# Patient Record
Sex: Female | Born: 1990 | Race: Black or African American | Hispanic: No | Marital: Single | Smoking: Current every day smoker
Health system: Southern US, Community
[De-identification: ages and names within clinical notes are randomized; demographics above are authoritative.]

## PROBLEM LIST (undated history)

## (undated) DIAGNOSIS — R87629 Unspecified abnormal cytological findings in specimens from vagina: Secondary | ICD-10-CM

## (undated) DIAGNOSIS — K219 Gastro-esophageal reflux disease without esophagitis: Secondary | ICD-10-CM

## (undated) DIAGNOSIS — K589 Irritable bowel syndrome without diarrhea: Secondary | ICD-10-CM

## (undated) DIAGNOSIS — Z9109 Other allergy status, other than to drugs and biological substances: Secondary | ICD-10-CM

## (undated) HISTORY — PX: NO PAST SURGERIES: SHX2092

---

## 2011-12-17 ENCOUNTER — Encounter (HOSPITAL_COMMUNITY): Payer: Self-pay | Admitting: *Deleted

## 2011-12-17 ENCOUNTER — Emergency Department (HOSPITAL_COMMUNITY)
Admission: EM | Admit: 2011-12-17 | Discharge: 2011-12-17 | Disposition: A | Discharge status: Home / Self Care | Attending: Emergency Medicine | Admitting: Emergency Medicine

## 2011-12-17 DIAGNOSIS — R51 Headache: Secondary | ICD-10-CM | POA: Insufficient documentation

## 2011-12-17 DIAGNOSIS — J329 Chronic sinusitis, unspecified: Secondary | ICD-10-CM

## 2011-12-17 DIAGNOSIS — J029 Acute pharyngitis, unspecified: Secondary | ICD-10-CM | POA: Insufficient documentation

## 2011-12-17 DIAGNOSIS — J3489 Other specified disorders of nose and nasal sinuses: Secondary | ICD-10-CM | POA: Insufficient documentation

## 2011-12-17 MED ORDER — PSEUDOEPHEDRINE HCL ER 120 MG PO TB12: 120.0000 mg | ORAL_TABLET | Freq: Two times a day (BID) | ORAL | Status: DC

## 2011-12-17 NOTE — ED Provider Notes (Signed)
Medical screening examination/treatment/procedure(s) were performed by non-physician practitioner and as supervising physician I was immediately available for consultation/collaboration. Fenix Ruppe Y.   Camora Tremain Y. Branden Vine, MD 12/17/11 0043 

## 2011-12-17 NOTE — ED Notes (Signed)
Headache today with sinus  Pressure.  She has had a cold for 2-3 days

## 2011-12-17 NOTE — ED Notes (Signed)
Pt c/o congestion, sore throat and sinus pressure with headache. Appears in no acute distress.

## 2011-12-17 NOTE — ED Provider Notes (Signed)
History     CSN: [id number]  Arrival date & time 12/17/11  0010   First MD Initiated Contact with Patient 12/17/11 0030      Chief Complaint  Patient presents with  . Headache    (Consider location/radiation/quality/duration/timing/severity/associated sxs/prior treatment) HPI Comments: Patient with upper respiratory tract infection, facial fullness and pain, congestion, yesterday, took Mucinex, which has upset her stomach.  The symptoms have been going on for approximately one week.  Denies fever or chills  Patient is a 21 y.o. female presenting with headaches. The history is provided by the patient.  Headache  The current episode started more than 2 days ago. The problem occurs constantly. The pain is located in the frontal region. The pain is at a severity of 5/10. The pain is moderate. Pertinent negatives include no fever.    History reviewed. No pertinent past medical history.  History reviewed. No pertinent past surgical history.  History reviewed. No pertinent family history.  History  Substance Use Topics  . Smoking status: Never Smoker   . Smokeless tobacco: Not on file  . Alcohol Use: No    OB History    Grav Para Term Preterm Abortions TAB SAB Ect Mult Living                  Review of Systems  Constitutional: Negative for fever.  HENT: Positive for congestion, sore throat and sinus pressure. Negative for rhinorrhea.   Respiratory: Negative for cough.   Musculoskeletal: Positive for myalgias.  Neurological: Positive for headaches. Negative for dizziness and weakness.    Allergies  Latex  Home Medications   Current Outpatient Rx  Name Route Sig Dispense Refill  . DICYCLOMINE HCL 10 MG PO CAPS Oral Take 10 mg by mouth 4 (four) times daily.    Marland Kitchen LEVOCETIRIZINE DIHYDROCHLORIDE 5 MG PO TABS Oral Take 5 mg by mouth every evening.    Marland Kitchen MONTELUKAST SODIUM 10 MG PO TABS Oral Take 10 mg by mouth daily.    Marland Kitchen OMEPRAZOLE 20 MG PO CPDR Oral Take 20 mg by mouth  daily.    Marland Kitchen OVER THE COUNTER MEDICATION Oral Take 1 tablet by mouth once. Mucinex Cold and Flu (multi-symptom)    . PSEUDOEPHEDRINE HCL ER 120 MG PO TB12 Oral Take 1 tablet (120 mg total) by mouth every 12 (twelve) hours. 20 tablet 0    BP 129/81  Pulse 100  Temp(Src) 97.7 F (36.5 C) (Oral)  Resp 16  SpO2 99%  Physical Exam  Constitutional: She is oriented to person, place, and time. She appears well-developed.  Neck: Normal range of motion.  Cardiovascular: Normal rate.   Pulmonary/Chest: Effort normal.  Musculoskeletal: Normal range of motion.  Neurological: She is alert and oriented to person, place, and time.  Skin: Skin is warm and dry.  Psychiatric: She has a normal mood and affect.    ED Course  Procedures (including critical care time)  Labs Reviewed - No data to display No results found.   1. Sinusitis     Sinusitis will prescribe Sudafed, Tylenol Sinus, Advil sinus as over-the-counter medications  MDM  Sinusitis        Arman Filter, NP 12/17/11 0037  Arman Filter, NP 12/17/11 0040

## 2012-05-06 ENCOUNTER — Ambulatory Visit

## 2012-05-06 ENCOUNTER — Ambulatory Visit (INDEPENDENT_AMBULATORY_CARE_PROVIDER_SITE_OTHER): Admitting: Internal Medicine

## 2012-05-06 VITALS — BP 123/73 | HR 84 | Temp 98.1°F | Resp 16 | Ht 64.5 in | Wt 146.0 lb

## 2012-05-06 DIAGNOSIS — M79609 Pain in unspecified limb: Secondary | ICD-10-CM

## 2012-05-06 DIAGNOSIS — M79673 Pain in unspecified foot: Secondary | ICD-10-CM

## 2012-05-06 NOTE — Progress Notes (Signed)
  Subjective:    Patient ID: Courtney Martinez, female    DOB: 11/20/1991, 21 y.o.   MRN: [id number]  HPISomeone closed the door on her foot striking the medial aspect at the first MTP 4 days ago She continues to have pain with motion around first and second MTPs and great pain going up stairs    Review of Systems     Objective:   Physical Exam The foot is not swollen and there is no ecchymoses She is exquisitely tender over the medial aspect of the first MTP and over the area just proximal to the second MTP/range of motion of these areas hurts      [facility] reading (PRIMARY) by  Dr. Chia Mowers=No fracture seen around the first or second MTP   Assessment & Plan:  Problem #1 pain foot secondary to contusion Postop shoe to control motion around the first MTP Tylenol or ibuprofen as needed

## 2012-07-17 ENCOUNTER — Telehealth: Payer: Self-pay

## 2012-07-17 NOTE — Telephone Encounter (Signed)
Patient dropped of paperwork to be filled out by Dr. Merla Riches. Papers in [facility] [employer] box.

## 2012-07-17 NOTE — Telephone Encounter (Signed)
Accident Claim Form put in Dr Doolittle's box.

## 2012-07-23 NOTE — Telephone Encounter (Signed)
Notified pt that form is ready for p/up

## 2012-11-14 ENCOUNTER — Encounter (HOSPITAL_COMMUNITY): Payer: Self-pay | Admitting: Emergency Medicine

## 2012-11-14 ENCOUNTER — Emergency Department (HOSPITAL_COMMUNITY)
Admission: EM | Admit: 2012-11-14 | Discharge: 2012-11-14 | Disposition: A | Discharge status: Home / Self Care | Attending: Emergency Medicine | Admitting: Emergency Medicine

## 2012-11-14 DIAGNOSIS — J309 Allergic rhinitis, unspecified: Secondary | ICD-10-CM

## 2012-11-14 DIAGNOSIS — J3489 Other specified disorders of nose and nasal sinuses: Secondary | ICD-10-CM | POA: Insufficient documentation

## 2012-11-14 DIAGNOSIS — Z79899 Other long term (current) drug therapy: Secondary | ICD-10-CM | POA: Insufficient documentation

## 2012-11-14 DIAGNOSIS — K219 Gastro-esophageal reflux disease without esophagitis: Secondary | ICD-10-CM | POA: Insufficient documentation

## 2012-11-14 HISTORY — DX: Gastro-esophageal reflux disease without esophagitis: K21.9

## 2012-11-14 MED ORDER — LORATADINE 10 MG PO TABS: 10.0000 mg | ORAL_TABLET | Freq: Every day | ORAL | Status: DC

## 2012-11-14 MED ORDER — ALBUTEROL SULFATE HFA 108 (90 BASE) MCG/ACT IN AERS
2.0000 | INHALATION_SPRAY | RESPIRATORY_TRACT | Status: DC | PRN
  Administered 2012-11-14: 2 via RESPIRATORY_TRACT
  Filled 2012-11-14: qty 6.7

## 2012-11-14 NOTE — ED Notes (Signed)
PA at bedside.

## 2012-11-14 NOTE — ED Notes (Signed)
C/o sore throat, runny nose, and congestion since Saturday.

## 2012-11-14 NOTE — ED Provider Notes (Signed)
History     CSN: [id number]  Arrival date & time 11/14/12  2100   None     Chief Complaint  Patient presents with  . Sore Throat  . Nasal Congestion    (Consider location/radiation/quality/duration/timing/severity/associated sxs/prior treatment) HPI History provided by pt.   Pt has h/o allergic rhinitis, otherwise healthy.  Developed rhinorrhea, sneezing, pruritis of throat and chest tightness 4 days ago.  Now w/ sore throat, voice changes and mild cough.  Denies fever, sinus pressure, SOB, abd pain, N/V/D.  Has taken xyzal and singulair in the past w/out relief.  No known sick contacts.  Past Medical History  Diagnosis Date  . Acid reflux     History reviewed. No pertinent past surgical history.  No family history on file.  History  Substance Use Topics  . Smoking status: Never Smoker   . Smokeless tobacco: Not on file  . Alcohol Use: Yes     Comment: social    OB History    Grav Para Term Preterm Abortions TAB SAB Ect Mult Living                  Review of Systems  All other systems reviewed and are negative.    Allergies  Latex  Home Medications   Current Outpatient Rx  Name  Route  Sig  Dispense  Refill  . LEVOCETIRIZINE DIHYDROCHLORIDE 5 MG PO TABS   Oral   Take 5 mg by mouth daily as needed. For seasonal allergies         . MONTELUKAST SODIUM 10 MG PO TABS   Oral   Take 10 mg by mouth daily as needed. For seasonal allergies         . OMEPRAZOLE 20 MG PO CPDR   Oral   Take 20 mg by mouth daily as needed. For acid reflux         . PHENYLEPHRINE-DM-GG-APAP 5-10-200-325 MG PO TABS   Oral   Take 1 tablet by mouth every 12 (twelve) hours as needed. For chest congestion         . LORATADINE 10 MG PO TABS   Oral   Take 1 tablet (10 mg total) by mouth daily. One po bid x 3 days and then qd prn   30 tablet   0     BP 124/66  Pulse 66  Temp 98.2 F (36.8 C) (Oral)  Resp 18  SpO2 100%  Physical Exam  Nursing note and vitals  reviewed. Constitutional: She is oriented to person, place, and time. She appears well-developed and well-nourished. No distress.  HENT:  Head: Normocephalic and atraumatic.       No tonsillar edema, erythema or exudate.  Nml posterior pharynx.   Nasal congestion.   Eyes:       Normal appearance  Neck: Normal range of motion.  Cardiovascular: Normal rate and regular rhythm.   Pulmonary/Chest: Effort normal and breath sounds normal. No respiratory distress. She has no wheezes.  Musculoskeletal: Normal range of motion.  Lymphadenopathy:    She has no cervical adenopathy.  Neurological: She is alert and oriented to person, place, and time.  Skin: Skin is warm and dry. No rash noted.  Psychiatric: She has a normal mood and affect. Her behavior is normal.    ED Course  Procedures (including critical care time)  Labs Reviewed - No data to display No results found.   1. Allergic rhinitis       MDM  21yo F  w/ h/o allergic rhinitis presents w/ nasal congestion/rhinorrhea, sneezing, throat pruritis/pain, chest tightness and mild cough x 4 days.  No fever, respiratory distress, wheezing, sinus tenderness, tonsillar edema/exudate on exam.  Will treat symptomatically for viral URI vs. Allergic rhinitis.  Pt received an albuterol inhaler and I recommended claritin bid x 3 days and then qd as well as sudafed, ibuprofen, rest and fluids.  She has a steroid nasal spray at home.  Return precautions discussed.          Otilio Miu, PA-C 11/14/12 [phone]

## 2012-11-15 NOTE — ED Provider Notes (Signed)
Medical screening examination/treatment/procedure(s) were performed by non-physician practitioner and as supervising physician I was immediately available for consultation/collaboration.  Toy Baker, MD 11/15/12 [phone]

## 2013-09-09 ENCOUNTER — Encounter (HOSPITAL_COMMUNITY): Payer: Self-pay | Admitting: Emergency Medicine

## 2013-09-09 ENCOUNTER — Emergency Department (HOSPITAL_COMMUNITY)

## 2013-09-09 ENCOUNTER — Emergency Department (HOSPITAL_COMMUNITY)
Admission: EM | Admit: 2013-09-09 | Discharge: 2013-09-09 | Disposition: A | Discharge status: Home / Self Care | Attending: Emergency Medicine | Admitting: Emergency Medicine

## 2013-09-09 DIAGNOSIS — Z91040 Latex allergy status: Secondary | ICD-10-CM | POA: Insufficient documentation

## 2013-09-09 DIAGNOSIS — R059 Cough, unspecified: Secondary | ICD-10-CM | POA: Insufficient documentation

## 2013-09-09 DIAGNOSIS — J3489 Other specified disorders of nose and nasal sinuses: Secondary | ICD-10-CM | POA: Insufficient documentation

## 2013-09-09 DIAGNOSIS — Z8719 Personal history of other diseases of the digestive system: Secondary | ICD-10-CM | POA: Insufficient documentation

## 2013-09-09 DIAGNOSIS — B9789 Other viral agents as the cause of diseases classified elsewhere: Secondary | ICD-10-CM | POA: Insufficient documentation

## 2013-09-09 DIAGNOSIS — R05 Cough: Secondary | ICD-10-CM | POA: Insufficient documentation

## 2013-09-09 DIAGNOSIS — B349 Viral infection, unspecified: Secondary | ICD-10-CM

## 2013-09-09 MED ORDER — FEXOFENADINE-PSEUDOEPHED ER 60-120 MG PO TB12: 1.0000 | ORAL_TABLET | Freq: Two times a day (BID) | ORAL | Status: DC | PRN

## 2013-09-09 NOTE — ED Notes (Signed)
Patient states she has been having "sinus problems" for a few days.  Patient states is coughing and blowing green mucous.   Patient denies travel outside the country and has not been around anyone that has.   Patient states she usually gets nose bleeds and has had a couple.

## 2013-09-09 NOTE — ED Notes (Signed)
MD to bedside.

## 2013-09-15 NOTE — ED Provider Notes (Signed)
CSN: [id number]     Arrival date & time 09/09/13  1009 History   First MD Initiated Contact with Patient 09/09/13 1037     Chief Complaint  Patient presents with  . chest congestion   . Nasal Congestion   (Consider location/radiation/quality/duration/timing/severity/associated sxs/prior Treatment) HPI  22 year old female with facial pressure and congestion for the past 3-4 days. Occasional cough which is productive for greenish appearing mucus. No fevers or chills. No shortness of breath. No ear or throat pain. No nausea or vomiting. No urinary complaints. No unusual leg pain or swelling. No sick contacts. She has had some intermittent nosebleeds which was stopped with pressure. No significant past medical history except gerd. No intervention prior to arrival. Nonsmoker.  Past Medical History  Diagnosis Date  . Acid reflux    History reviewed. No pertinent past surgical history. No family history on file. History  Substance Use Topics  . Smoking status: Never Smoker   . Smokeless tobacco: Not on file  . Alcohol Use: Yes     Comment: social   OB History   Grav Para Term Preterm Abortions TAB SAB Ect Mult Living                 Review of Systems  All systems reviewed and negative, other than as noted in HPI.   Allergies  Latex  Home Medications   Current Outpatient Rx  Name  Route  Sig  Dispense  Refill  . OVER THE COUNTER MEDICATION   Oral   Take 5 mLs by mouth every 6 (six) hours as needed (for cold symptoms). Tyelnol Cold symptom liquid         . fexofenadine-pseudoephedrine (ALLEGRA-D) 60-120 MG per tablet   Oral   Take 1 tablet by mouth 2 (two) times daily as needed (congestion/cough).   20 tablet   0    BP 116/70  Pulse 83  Temp(Src) 98.3 F (36.8 C) (Oral)  Resp 18  SpO2 99% Physical Exam  Nursing note and vitals reviewed. Constitutional: She appears well-developed and well-nourished. No distress.  HENT:  Head: Normocephalic and atraumatic.   Eyes: Conjunctivae are normal. Right eye exhibits no discharge. Left eye exhibits no discharge.  Neck: Neck supple.  Cardiovascular: Normal rate, regular rhythm and normal heart sounds.  Exam reveals no gallop and no friction rub.   No murmur heard. Pulmonary/Chest: Effort normal and breath sounds normal. No respiratory distress.  Abdominal: Soft. She exhibits no distension. There is no tenderness.  Musculoskeletal: She exhibits no edema and no tenderness.  Lower extremities symmetric as compared to each other. No calf tenderness. Negative Homan's. No palpable cords.   Neurological: She is alert.  Skin: Skin is warm and dry.  Psychiatric: She has a normal mood and affect. Her behavior is normal. Thought content normal.    ED Course  Procedures (including critical care time) Labs Review Labs Reviewed - No data to display Imaging Review No results found.  EKG Interpretation   None      Dg Chest 2 View  09/09/2013   CLINICAL DATA:  Cough.  EXAM: CHEST  2 VIEW  COMPARISON:  None.  FINDINGS: The heart size and mediastinal contours are within normal limits. Both lungs are clear. The visualized skeletal structures are unremarkable.  IMPRESSION: No active cardiopulmonary disease.   Electronically Signed   By: Charlett Nose M.D.   On: 09/09/2013 10:48   MDM   1. Viral illness    22 year old female with likely  viral illness. No increased work of breathing. Lungs are clear. Oxygen saturations are good on room air. Chest x-ray without acute abnormality. Doubt pulmonary embolism or other potential emergent etiology. Plan symptomatic treatment at this time. Return precautions discussed. Outpatient followup as needed otherwise.    Raeford Razor, MD 09/15/13 [phone]

## 2013-12-02 ENCOUNTER — Encounter (HOSPITAL_COMMUNITY): Payer: Self-pay | Admitting: *Deleted

## 2013-12-02 ENCOUNTER — Inpatient Hospital Stay (HOSPITAL_COMMUNITY)
Admission: AD | Admit: 2013-12-02 | Discharge: 2013-12-02 | Disposition: A | Discharge status: Home / Self Care | Source: Ambulatory Visit | Attending: Obstetrics & Gynecology | Admitting: Obstetrics & Gynecology

## 2013-12-02 DIAGNOSIS — N938 Other specified abnormal uterine and vaginal bleeding: Secondary | ICD-10-CM | POA: Insufficient documentation

## 2013-12-02 DIAGNOSIS — A5901 Trichomonal vulvovaginitis: Secondary | ICD-10-CM

## 2013-12-02 DIAGNOSIS — N949 Unspecified condition associated with female genital organs and menstrual cycle: Secondary | ICD-10-CM | POA: Insufficient documentation

## 2013-12-02 DIAGNOSIS — N898 Other specified noninflammatory disorders of vagina: Secondary | ICD-10-CM | POA: Insufficient documentation

## 2013-12-02 DIAGNOSIS — R109 Unspecified abdominal pain: Secondary | ICD-10-CM | POA: Insufficient documentation

## 2013-12-02 LAB — URINALYSIS, ROUTINE W REFLEX MICROSCOPIC
BILIRUBIN URINE: NEGATIVE
GLUCOSE, UA: NEGATIVE mg/dL
Hgb urine dipstick: NEGATIVE
KETONES UR: NEGATIVE mg/dL
NITRITE: NEGATIVE
PH: 7.5 (ref 5.0–8.0)
PROTEIN: NEGATIVE mg/dL
Specific Gravity, Urine: 1.02 (ref 1.005–1.030)
Urobilinogen, UA: 4 mg/dL — ABNORMAL HIGH (ref 0.0–1.0)

## 2013-12-02 LAB — URINE MICROSCOPIC-ADD ON

## 2013-12-02 LAB — WET PREP, GENITAL
CLUE CELLS WET PREP: NONE SEEN
Yeast Wet Prep HPF POC: NONE SEEN

## 2013-12-02 LAB — POCT PREGNANCY, URINE: Preg Test, Ur: NEGATIVE

## 2013-12-02 MED ORDER — METRONIDAZOLE 500 MG PO TABS
2000.0000 mg | ORAL_TABLET | Freq: Once | ORAL | Status: AC
  Administered 2013-12-02: 2000 mg via ORAL
  Filled 2013-12-02: qty 4

## 2013-12-02 NOTE — Discharge Instructions (Signed)
Trichomoniasis Trichomoniasis is an infection, caused by the Trichomonas organism, that affects both women and men. In women, the outer female genitalia and the vagina are affected. In men, the penis is mainly affected, but the prostate and other reproductive organs can also be involved. Trichomoniasis is a sexually transmitted disease (STD) and is most often passed to another person through sexual contact. The majority of people who get trichomoniasis do so from a sexual encounter and are also at risk for other STDs. CAUSES   Sexual intercourse with an infected partner.  It can be present in swimming pools or hot tubs. SYMPTOMS   Abnormal gray-green frothy vaginal discharge in women.  Vaginal itching and irritation in women.  Itching and irritation of the area outside the vagina in women.  Penile discharge with or without pain in males.  Inflammation of the urethra (urethritis), causing painful urination.  Bleeding after sexual intercourse. RELATED COMPLICATIONS  Pelvic inflammatory disease.  Infection of the uterus (endometritis).  Infertility.  Tubal (ectopic) pregnancy.  It can be associated with other STDs, including gonorrhea and chlamydia, hepatitis B, and HIV. COMPLICATIONS DURING PREGNANCY  Early (premature) delivery.  Premature rupture of the membranes (PROM).  Low birth weight. DIAGNOSIS   Visualization of Trichomonas under the microscope from the vagina discharge.  Ph of the vagina greater than 4.5, tested with a test tape.  Trich Rapid Test.  Culture of the organism, but this is not usually needed.  It may be found on a Pap test.  Having a "strawberry cervix,"which means the cervix looks very red like a strawberry. TREATMENT   You may be given medication to fight the infection. Inform your caregiver if you could be or are pregnant. Some medications used to treat the infection should not be taken during pregnancy.  Over-the-counter medications or  creams to decrease itching or irritation may be recommended.  Your sexual partner will need to be treated if infected. HOME CARE INSTRUCTIONS   Take all medication prescribed by your caregiver.  Take over-the-counter medication for itching or irritation as directed by your caregiver.  Do not have sexual intercourse while you have the infection.  Do not douche or wear tampons.  Discuss your infection with your partner, as your partner may have acquired the infection from you. Or, your partner may have been the person who transmitted the infection to you.  Have your sex partner examined and treated if necessary.  Practice safe, informed, and protected sex.  See your caregiver for other STD testing. SEEK MEDICAL CARE IF:   You still have symptoms after you finish the medication.  You have an oral temperature above 102 F (38.9 C).  You develop belly (abdominal) pain.  You have pain when you urinate.  You have bleeding after sexual intercourse.  You develop a rash.  The medication makes you sick or makes you throw up (vomit). Document Released: 05/10/2001 Document Revised: 02/06/2012 Document Reviewed: 06/05/2009 [facility] Patient Information 2014 [place], [place].  Sexually Transmitted Disease Sexually transmitted disease (STD) refers to any infection that is passed from person to person during sexual activity. This may happen by way of saliva, semen, blood, vaginal mucus, or urine. Common STDs include:  Gonorrhea.  Chlamydia.  Syphilis.  HIV/AIDS.  Genital herpes.  Hepatitis B and C.  Trichomonas.  Human papillomavirus (HPV).  Pubic lice. CAUSES  An STD may be spread by bacteria, virus, or parasite. A person can get an STD by:  Sexual intercourse with an infected person.  Sharing  sex toys with an infected person.  Sharing needles with an infected person.  Having intimate contact with the genitals, mouth, or rectal areas of an infected person. SYMPTOMS    Some people may not have any symptoms, but they can still pass the infection to others. Different STDs have different symptoms. Symptoms include:  Painful or bloody urination.  Pain in the pelvis, abdomen, vagina, anus, throat, or eyes.  Skin rash, itching, irritation, growths, or sores (lesions). These usually occur in the genital or anal area.  Abnormal vaginal discharge.  Penile discharge in men.  Soft, flesh-colored skin growths in the genital or anal area.  Fever.  Pain or bleeding during sexual intercourse.  Swollen glands in the groin area.  Yellow skin and eyes (jaundice). This is seen with hepatitis. DIAGNOSIS  To make a diagnosis, your caregiver may:  Take a medical history.  Perform a physical exam.  Take a specimen (culture) to be examined.  Examine a sample of discharge under a microscope.  Perform blood tests.  Perform a Pap test, if this applies.  Perform a colposcopy.  Perform a laparoscopy. TREATMENT   Chlamydia, gonorrhea, trichomonas, and syphilis can be cured with antibiotic medicine.  Genital herpes, hepatitis, and HIV can be treated, but not cured, with prescribed medicines. The medicines will lessen the symptoms.  Genital warts from HPV can be treated with medicine or by freezing, burning (electrocautery), or surgery. Warts may come back.  HPV is a virus and cannot be cured with medicine or surgery.However, abnormal areas may be followed very closely by your caregiver and may be removed from the cervix, vagina, or vulva through office procedures or surgery. If your diagnosis is confirmed, your recent sexual partners need treatment. This is true even if they are symptom-free or have a negative culture or evaluation. They should not have sex until their caregiver says it is okay. HOME CARE INSTRUCTIONS  All sexual partners should be informed, tested, and treated for all STDs.  Take your antibiotics as directed. Finish them even if you start  to feel better.  Only take over-the-counter or prescription medicines for pain, discomfort, or fever as directed by your caregiver.  Rest.  Eat a balanced diet and drink enough fluids to keep your urine clear or pale yellow.  Do not have sex until treatment is completed and you have followed up with your caregiver. STDs should be checked after treatment.  Keep all follow-up appointments, Pap tests, and blood tests as directed by your caregiver.  Only use latex condoms and water-soluble lubricants during sexual activity. Do not use petroleum jelly or oils.  Avoid alcohol and illegal drugs.  Get vaccinated for HPV and hepatitis. If you have not received these vaccines in the past, talk to your caregiver about whether one or both might be right for you.  Avoid risky sex practices that can break the skin. The only way to avoid getting an STD is to avoid all sexual activity.Latex condoms and dental dams (for oral sex) will help lessen the risk of getting an STD, but will not completely eliminate the risk. SEEK MEDICAL CARE IF:   You have a fever.  You have any new or worsening symptoms. Document Released: 02/04/2003 Document Revised: 02/06/2012 Document Reviewed: 06/04/2013 [facility] Patient Information 2014 [place]

## 2013-12-02 NOTE — MAU Note (Signed)
Patient states she has been getting Depo injections. Was about 3 weeks late getting the last shot but got it about 2 weeks ago. States she has had some light spotting and cramping.

## 2013-12-02 NOTE — MAU Provider Note (Signed)
History     CSN: [id number]  Arrival date and time: 12/02/13 1608   None     Chief Complaint  Patient presents with  . Vaginal Discharge  . Abdominal Cramping   HPI This is a 23 y.o. female who presents with c/o cramping and spotting for several days. Was over 3 weeks late getting her DepoProvera and then got it at [facility] 2 wks ago. ALso has some vaginal discharge. Just had STD testing but wants to check for yeast, etc. Never had bleeding while on DepoProvera before.   RN Note:  Patient states she has been getting Depo injections. Was about 3 weeks late getting the last shot but got it about 2 weeks ago. States she has had some light spotting and cramping.      Also has new discharge, no odor, no itching, but wants it checked out. Just had STD testing in Sept, no intercourse since then.    OB History   Grav Para Term Preterm Abortions TAB SAB Ect Mult Living                  Past Medical History  Diagnosis Date  . Acid reflux     History reviewed. No pertinent past surgical history.  History reviewed. No pertinent family history.  History  Substance Use Topics  . Smoking status: Never Smoker   . Smokeless tobacco: Not on file  . Alcohol Use: Yes     Comment: social    Allergies:  Allergies  Allergen Reactions  . Latex     Patient stated just "spot" allergy on face    Prescriptions prior to admission  Medication Sig Dispense Refill  . fexofenadine-pseudoephedrine (ALLEGRA-D) 60-120 MG per tablet Take 1 tablet by mouth 2 (two) times daily as needed (congestion/cough).  20 tablet  0  . OVER THE COUNTER MEDICATION Take 5 mLs by mouth every 6 (six) hours as needed (for cold symptoms). Tyelnol Cold symptom liquid        Review of Systems  Constitutional: Negative for fever, chills and malaise/fatigue.  Gastrointestinal: Positive for abdominal pain. Negative for nausea, vomiting, diarrhea and constipation.  Genitourinary: Negative for dysuria.   Musculoskeletal: Negative for myalgias.  Neurological: Negative for dizziness, weakness and headaches.   Physical Exam   Blood pressure 119/72, pulse 68, temperature 98.5 F (36.9 C), temperature source Oral, resp. rate 16, height 5' 4.5" (1.638 m), weight 60.419 kg (133 lb 3.2 oz), SpO2 100.00%.  Physical Exam  Constitutional: She is oriented to person, place, and time. She appears well-developed and well-nourished. No distress.  HENT:  Head: Normocephalic.  Cardiovascular: Normal rate.   Respiratory: Effort normal.  GI: Soft. She exhibits no distension. There is no tenderness. There is no rebound and no guarding.  Genitourinary: Uterus normal. Vaginal discharge (No blodd seen, slightly pink discharge) found.  Uterus small and nontender Adnexae nontender   Musculoskeletal: Normal range of motion.  Neurological: She is alert and oriented to person, place, and time.  Skin: Skin is warm and dry.  Psychiatric: She has a normal mood and affect.    MAU Course  Procedures  MDM Results for orders placed during the hospital encounter of 12/02/13 (from the past 24 hour(s))  URINALYSIS, ROUTINE W REFLEX MICROSCOPIC     Status: Abnormal   Collection Time    12/02/13  4:55 PM      Result Value Range   Color, Urine YELLOW  YELLOW   APPearance HAZY (*) CLEAR  Specific Gravity, Urine 1.020  1.005 - 1.030   pH 7.5  5.0 - 8.0   Glucose, UA NEGATIVE  NEGATIVE mg/dL   Hgb urine dipstick NEGATIVE  NEGATIVE   Bilirubin Urine NEGATIVE  NEGATIVE   Ketones, ur NEGATIVE  NEGATIVE mg/dL   Protein, ur NEGATIVE  NEGATIVE mg/dL   Urobilinogen, UA 4.0 (*) 0.0 - 1.0 mg/dL   Nitrite NEGATIVE  NEGATIVE   Leukocytes, UA MODERATE (*) NEGATIVE  URINE MICROSCOPIC-ADD ON     Status: Abnormal   Collection Time    12/02/13  4:55 PM      Result Value Range   Squamous Epithelial / LPF FEW (*) RARE   WBC, UA 21-50  <3 WBC/hpf   Urine-Other MUCOUS PRESENT    POCT PREGNANCY, URINE     Status: None    Collection Time    12/02/13  5:12 PM      Result Value Range   Preg Test, Ur NEGATIVE  NEGATIVE  WET PREP, GENITAL     Status: Abnormal   Collection Time    12/02/13  6:08 PM      Result Value Range   Yeast Wet Prep HPF POC NONE SEEN  NONE SEEN   Trich, Wet Prep FEW (*) NONE SEEN   Clue Cells Wet Prep HPF POC NONE SEEN  NONE SEEN   WBC, Wet Prep HPF POC MANY (*) NONE SEEN     Assessment and Plan  A:  Cramping and spotting after missing DepoProvera for a month       Trichomonas infection  P; Informed her that missing Depo for so long is probably why she has cramping and bleeding.       Will probably resolve once she gets back on it for a few months      Discussed Trich      Flagyl given, informed of need to contact her partner to tell him to get treated.        [facility] CenterWILLIAMS,Talea Manges 12/02/2013, 5:57 PM

## 2014-04-16 ENCOUNTER — Emergency Department (HOSPITAL_COMMUNITY)
Admission: EM | Admit: 2014-04-16 | Discharge: 2014-04-16 | Disposition: A | Discharge status: Home / Self Care | Attending: Emergency Medicine | Admitting: Emergency Medicine

## 2014-04-16 ENCOUNTER — Encounter (HOSPITAL_COMMUNITY): Payer: Self-pay | Admitting: Emergency Medicine

## 2014-04-16 DIAGNOSIS — H5710 Ocular pain, unspecified eye: Secondary | ICD-10-CM

## 2014-04-16 DIAGNOSIS — M25519 Pain in unspecified shoulder: Secondary | ICD-10-CM | POA: Insufficient documentation

## 2014-04-16 DIAGNOSIS — K219 Gastro-esophageal reflux disease without esophagitis: Secondary | ICD-10-CM | POA: Insufficient documentation

## 2014-04-16 DIAGNOSIS — M549 Dorsalgia, unspecified: Secondary | ICD-10-CM | POA: Insufficient documentation

## 2014-04-16 DIAGNOSIS — M62838 Other muscle spasm: Secondary | ICD-10-CM | POA: Insufficient documentation

## 2014-04-16 DIAGNOSIS — H5789 Other specified disorders of eye and adnexa: Secondary | ICD-10-CM | POA: Insufficient documentation

## 2014-04-16 DIAGNOSIS — H579 Unspecified disorder of eye and adnexa: Secondary | ICD-10-CM | POA: Insufficient documentation

## 2014-04-16 DIAGNOSIS — Z91040 Latex allergy status: Secondary | ICD-10-CM | POA: Insufficient documentation

## 2014-04-16 DIAGNOSIS — Z87828 Personal history of other (healed) physical injury and trauma: Secondary | ICD-10-CM | POA: Insufficient documentation

## 2014-04-16 DIAGNOSIS — J309 Allergic rhinitis, unspecified: Secondary | ICD-10-CM | POA: Insufficient documentation

## 2014-04-16 MED ORDER — CYCLOBENZAPRINE HCL 10 MG PO TABS: 10.0000 mg | ORAL_TABLET | Freq: Every day | ORAL | Status: DC

## 2014-04-16 MED ORDER — NAPROXEN 500 MG PO TABS: 500.0000 mg | ORAL_TABLET | Freq: Two times a day (BID) | ORAL | Status: DC

## 2014-04-16 NOTE — Progress Notes (Signed)
[facility] CL provided pt with a list of primary care resources to help patient establish primary care.  °

## 2014-04-16 NOTE — Discharge Instructions (Signed)
Call for a follow up appointment with a Family or Primary Care Provider.  Return if Symptoms worsen.   Take medication as prescribed.  Avoid environmental allergens that worsened her symptoms. Ice your shoulder 3-4 times a day.   Emergency Department Resource Guide 1) Find a Doctor and Pay Out of Pocket Although you won't have to find out who is covered by your insurance plan, it is a good idea to ask around and get recommendations. You will then need to call the office and see if the doctor you have chosen will accept you as a new patient and what types of options they offer for patients who are self-pay. Some doctors offer discounts or will set up payment plans for their patients who do not have insurance, but you will need to ask so you aren't surprised when you get to your appointment.  2) Contact Your Local Health Department Not all health departments have doctors that can see patients for sick visits, but many do, so it is worth a call to see if yours does. If you don't know where your local health department is, you can check in your phone book. The CDC also has a tool to help you locate your state's health department, and many state websites also have listings of all of their local health departments.  3) Find a Walk-in Clinic If your illness is not likely to be very severe or complicated, you may want to try a walk in clinic. These are popping up all over the country in pharmacies, drugstores, and shopping centers. They're usually staffed by nurse practitioners or physician assistants that have been trained to treat common illnesses and complaints. They're usually fairly quick and inexpensive. However, if you have serious medical issues or chronic medical problems, these are probably not your best option.  No Primary Care Doctor: - Call Health Connect at  [phone] - they can help you locate a primary care doctor that  accepts your insurance, provides certain services, etc. - Physician  Referral Service- [phone]  Chronic Pain Problems: Organization         Address  Phone   Notes  [facility]  [phone] Patients need to be referred by their primary care doctor.   Medication Assistance: Organization         Address  Phone   Notes  [facility] Medication [facility]istance Program [address] [place], [place] [address] [phone]) [phone] --Must be a resident of [place] -- Must have NO insurance coverage whatsoever (no Medicaid/ Medicare, etc.) -- The pt. MUST have a primary care doctor that directs their care regularly and follows them in the community   MedAssist  [phone]   [employer]  [phone]    Agencies that provide inexpensive medical care: Organization         Address  Phone   Notes  [facility]  [phone]) [phone]   [facility]    [phone]   [facility] [address] [place], [place] [address] [phone]   [facility] [address], [place] [phone]) [phone]   Planned Parenthood    [phone]   [facility]    [phone]   [facility]  [address], [place] Phone:  [phone], Fax:  [phone] Hours of Operation:  9 am - 6 pm, M-F.  Also accepts Medicaid/Medicare and self-pay.  [facility] for [place], [address], [place] Phone: [phone], Fax: [phone]. Hours of Operation:  8:30 am - 5:30 pm, M-F.  Also accepts Medicaid and self-pay.  [facility] [address] [place] Phone: [phone]   [place], [place], [place] [phone], Ext. [phone] Mondays & Thursdays: 7-9 AM.  First 15 patients are seen on a first come, first serve basis.    [place] Providers:  Organization         Address  Phone   Notes  [facility] [address], [place] [phone] Also accepts self-pay patients.  [facility] [address], [place]  [phone]   [place], [address], [place] [phone]   [facility] [address], [place] [phone]   Lucianne Lei [address], [place]   [phone] Only accepts [place] Access [place] patients after they have their name applied to their card.   Self-Pay (no insurance) in [facility]:  Organization         Address  Phone   Notes  Sickle Cell Patients, [facility] Medicine [place] [phone]   [facility]gent [facility] [place] [phone]   [facility] [place]  [place], [place], [place] [phone]   [facility]/Dr. Osei-Bonsu  [address] [place] or [address], [place] [phone] Phone number for both [place] and [place] locations is the same.  Urgent Medical and [facility] [address] [place] [phone]   [facility] [address] [place] or [address] [phone]   [facility] [address] [place] [phone], phone; [phone], fax Sees patients 1st and 3rd Saturday of every month.  Must not qualify for public or private insurance (i.e. Medicaid, Medicare, [place] Health Choice, Veterans' Benefits)  Household income should be no more than 200% of the poverty level The clinic cannot treat you if you are pregnant or think you are pregnant  Sexually transmitted diseases are not treated at the clinic.    Dental Care: Organization         Address  Phone  Notes  [facility] Department of [facility] [place] [phone] Accepts children up to age 84 who are enrolled  in [place] or [place]; pregnant women with a Medicaid card; and children who have applied for Medicaid or [place] Health Choice, but were declined, whose parents can pay a reduced fee at time of service.  [facility] of [facility]  [address], [place] [phone] Accepts children up to age 33 who are enrolled in [place] or [place]; pregnant women with a Medicaid card; and children who have applied for Medicaid or [place] Health Choice, but were declined, whose parents can pay a reduced fee at time of service.  [place] [facility] Adult [facility] Access PROGRAM  [place] [phone] Patients are seen by appointment only. Walk-ins are not accepted. [place] will see patients 49 years of age and older. Monday - Tuesday (8am-5pm) Most Wednesdays (8:30-5pm) $30 per  visit, cash only  [facility] Adult [employer] PROGRAM  [address], [facility] [phone] Patients are seen by appointment only. Walk-ins are not accepted. [place] will see patients 75 years of age and older. One Wednesday Evening (Monthly: Volunteer Based).  $30 per visit, cash only  [place]  [phone] for adults; Children under age 64, call Graduate Pediatric Dentistry at [phone]. Children aged 33-14, please call [phone] to request a pediatric application.  Dental services are provided in all areas of dental care including fillings, crowns and bridges, complete and partial dentures, implants, gum treatment, root canals, and extractions. Preventive care is also provided. Treatment is provided to both adults and children. Patients are selected via a lottery and there is often a waiting list.   [facility] [address], [place]  [phone] [email or web address]   [facility] [address] [place], [place] [phone], Ext. [phone] Second and Fourth Thursday of each month, opens at  6:30 AM; Clinic ends at 9 AM.  Patients are seen on a first-come first-served basis, and a limited number are seen during each clinic.   [facility]  [address] Hillard Danker [place], [place] [phone]   Eligibility Requirements You must have lived in [place], [place], or [place] counties for at least the last three months.   You cannot be eligible for state or federal sponsored [employer], including [employer], [place], or [employer].   You generally cannot be eligible for healthcare insurance through your employer.    How to apply: Eligibility screenings are held every Tuesday and Wednesday afternoon from 1:00 pm until 4:00 pm. You do not need an appointment for the interview!  [facility] [address], [place], [place]   [place]  [place] Department  [place]  [phone]    Behavioral Health Resources in the Community: Intensive Outpatient Programs Organization         Address  Phone  Notes  [place]. [address] [place], [place] [phone]   [facility]patient [address], [place], [place]   ADS: Alcohol & Drug Svcs [address], [place], [place]   [place] [address]  [place], [place] or [phone]   Substance Abuse Resources Organization         Address  Phone  Notes  Alcohol and Drug Services  [phone]   [place]  [phone]   The [place]   Chinita Pester  [phone]   Residential & Outpatient Substance Abuse Program  [phone]   Psychological Services Organization         Address  Phone  Notes  [facility]  [place]  [phone]   [place] [address], [place] or [phone]    Mobile Crisis Teams Organization         Address  Phone  Notes  [facility]rapeutic Alternatives, Mobile Crisis Care Unit  [phone]   [facility]ive Psychotherapeutic Services  [address] [place], [place]   [place] Levels [address] [place] [phone]    Self-Help/Support Groups Organization         Address  Phone  Notes  Mental Health Assoc. of [place] - variety of support groups  [place] Call for more information  Narcotics Anonymous (NA), Caring Services [address], [employer] [place]  2 meetings at this location   Special educational needs teacher         Address  Phone  Notes  ASAP Residential Treatment [place],    [place]  [phone]   [facility]  [address] [place] [phone], [place], [place]   [place], [place] [phone] Admissions: 8am-3pm M-F  Incentives Substance [place] [address],    [place], [place] [phone]   [facility] [address] [place], [place], [place]   [facility] [address]  [place], [place]   Insight Programs - Intensive Outpatient [address] Kristeen [place] [address], [place], [place]   [facility] ([place].) [place].,  [place], [place] [phone] or [phone]   [facility]idential Treatment Services (RTS) [address], [place], [place] Accepts Medicaid  [facility] [place] [address]  [place] [phone] Substance Abuse/Addiction Treatment   [facility] Organization         Address  Phone  Notes  [facility]  [phone]   Domenic Schwab, PhD [address] Arlis Porta [place], [place]   [phone] or [phone]   [place], [place] [phone]   [facility]  [address], [place], [place] [phone] Insurance/Medicaid/sponsorship through [facility] and Families [address]                                    [place], [place] [phone] [place] [address], [place] [phone]    Dr. Adele Schilder  [phone]   [facility] Dept. 1) [address], [place] 2) [place] 3)  [place] [address], [place] [phone]  [phone]   [place] [phone] or [phone] (After Hours)

## 2014-04-16 NOTE — ED Notes (Signed)
Pt c/o lt shoulder pain w/ mid back pain x 2 wks.  Describes it as soreness.  States she had "trouble with her shoulder since she was little". States that she also has bad allergies and woke up yesterday w/ her eyelids swollen.  No swelling noted now.  States that her carpet in her apartment got wet and they are drying it so she attributes it to that.

## 2014-04-16 NOTE — ED Provider Notes (Signed)
CSN: [id number]     Arrival date & time 04/16/14  1313 History  This chart was scribed for non-physician practitioner, Clabe SealLauren M Garnetta Fedrick, PA-C, working with Gilda Creasehristopher J. Pollina, * by Charline BillsEssence Howell, ED Scribe. This patient was seen in room WTR9/WTR9 and the patient's care was started at 2:44 PM.   Chief Complaint  Patient presents with  . Shoulder Pain  . Back Pain  . Eye Problem   HPI Comments: Courtney Martinez is a 23 y.o. female who presents to the Emergency Department complaining of L shoulder pain onset 1 week ago. Pt also reports associated back pain onset last night. Pain is worsened with movement and stiff upon waking. She denies fall or injury. She has not taken any medication for pain. No known allergies. The patient reports a remote history of left shoulder dislocation. Has not been evaluated by an orthopedic specialist. She also presents with gradually improving dry, itchy eyes after the carpet in her apartment got wet. Pt reports associated watery discharge and eye swelling upon waking.  The history is provided by the patient. No language interpreter was used.   Past Medical History  Diagnosis Date  . Acid reflux    No past surgical history on file. No family history on file. History  Substance Use Topics  . Smoking status: Never Smoker   . Smokeless tobacco: Not on file  . Alcohol Use: Yes     Comment: social   OB History   Grav Para Term Preterm Abortions TAB SAB Ect Mult Living                 Review of Systems  Eyes: Positive for discharge and itching.  Musculoskeletal: Positive for arthralgias and back pain.  Skin: Negative for color change.  Allergic/Immunologic: Positive for environmental allergies.  Neurological: Negative for weakness and numbness.  All other systems reviewed and are negative.  Allergies  Doxycycline and Latex  Home Medications   Prior to Admission medications   Medication Sig Start Date End Date Taking? Authorizing Provider  ibuprofen  (ADVIL,MOTRIN) 200 MG tablet Take 400 mg by mouth every 6 (six) hours as needed for mild pain.    Historical Provider, MD  omeprazole (PRILOSEC) 10 MG capsule Take 10 mg by mouth daily as needed (indigestion).    Historical Provider, MD   Triage Vitals: BP 128/69  Pulse 86  Temp(Src) 99 F (37.2 C) (Oral)  Resp 20  SpO2 100% Physical Exam  Nursing note and vitals reviewed. Constitutional: She is oriented to person, place, and time. She appears well-developed and well-nourished. She is cooperative.  Non-toxic appearance. She does not have a sickly appearance. She does not appear ill. No distress.  HENT:  Head: Normocephalic and atraumatic.  Eyes: Conjunctivae and EOM are normal. Pupils are equal, round, and reactive to light. Right eye exhibits no discharge and no exudate. Left eye exhibits no discharge and no exudate. Right conjunctiva is not injected. Right conjunctiva has no hemorrhage. Left conjunctiva is not injected. Left conjunctiva has no hemorrhage.  Neck: Normal range of motion. Neck supple.  Cardiovascular:  Pulses:      Radial pulses are 2+ on the right side, and 2+ on the left side.  Pulmonary/Chest: Effort normal. No respiratory distress.  Musculoskeletal: Normal range of motion.       Left shoulder: She exhibits tenderness and spasm. She exhibits normal range of motion, no crepitus, no deformity, no laceration, normal pulse and normal strength.  Arms: Spasm noted to trapezius muscle.   Neurological: She is alert and oriented to person, place, and time.  Skin: Skin is warm and dry. She is not diaphoretic.  Psychiatric: She has a normal mood and affect. Her behavior is normal.   ED Course  Procedures (including critical care time)  COORDINATION OF CARE: 2:49 PM-Discussed treatment plan with pt at bedside and pt agreed to plan.    MDM   Final diagnoses:  Shoulder pain  Muscle spasm of left shoulder area  Eye discomfort   Patient with left shoulder pain and  trapezius discomfort. Has history of dislocation. No obvious abnormalities on exam. Neurovascularly intact. Good strength. Will have patient followup with an orthopedic specialist, RICE encouraged. Eye discomfort likely environmental allergies, reports improvement, no signs of infection on examDiscussed treatment plan with the patient. Return precautions given. Reports understanding and no other concerns at this time.  Patient is stable for discharge at this time.  Meds given in ED:  Medications - No data to display  Discharge Medication List as of 04/16/2014  3:46 PM    START taking these medications   Details  cyclobenzaprine (FLEXERIL) 10 MG tablet Take 1 tablet (10 mg total) by mouth at bedtime., Starting 04/16/2014, Until Discontinued, Print    naproxen (NAPROSYN) 500 MG tablet Take 1 tablet (500 mg total) by mouth 2 (two) times daily. Take with food, Starting 04/16/2014, Until Discontinued, Print       I personally performed the services described in this documentation, which was scribed in my presence. The recorded information has been reviewed and is accurate.    Clabe SealLauren M Roland Prine, PA-C 04/18/14 0110

## 2014-04-18 NOTE — ED Provider Notes (Signed)
Medical screening examination/treatment/procedure(s) were performed by non-physician practitioner and as supervising physician I was immediately available for consultation/collaboration.   EKG Interpretation None        Rual Vermeer J. Sarenity Ramaker, MD 04/18/14 0821 

## 2014-07-14 ENCOUNTER — Encounter (HOSPITAL_COMMUNITY): Payer: Self-pay | Admitting: Emergency Medicine

## 2014-07-14 ENCOUNTER — Emergency Department (HOSPITAL_COMMUNITY)
Admission: EM | Admit: 2014-07-14 | Discharge: 2014-07-14 | Disposition: A | Discharge status: Home / Self Care | Attending: Emergency Medicine | Admitting: Emergency Medicine

## 2014-07-14 DIAGNOSIS — Z791 Long term (current) use of non-steroidal anti-inflammatories (NSAID): Secondary | ICD-10-CM | POA: Insufficient documentation

## 2014-07-14 DIAGNOSIS — R0789 Other chest pain: Secondary | ICD-10-CM

## 2014-07-14 DIAGNOSIS — J40 Bronchitis, not specified as acute or chronic: Secondary | ICD-10-CM | POA: Insufficient documentation

## 2014-07-14 DIAGNOSIS — R071 Chest pain on breathing: Secondary | ICD-10-CM | POA: Insufficient documentation

## 2014-07-14 DIAGNOSIS — Z79899 Other long term (current) drug therapy: Secondary | ICD-10-CM | POA: Insufficient documentation

## 2014-07-14 DIAGNOSIS — R079 Chest pain, unspecified: Secondary | ICD-10-CM | POA: Insufficient documentation

## 2014-07-14 DIAGNOSIS — K219 Gastro-esophageal reflux disease without esophagitis: Secondary | ICD-10-CM | POA: Insufficient documentation

## 2014-07-14 DIAGNOSIS — Z91040 Latex allergy status: Secondary | ICD-10-CM | POA: Insufficient documentation

## 2014-07-14 LAB — CBC
HCT: 38.1 % (ref 36.0–46.0)
Hemoglobin: 12.9 g/dL (ref 12.0–15.0)
MCH: 28 pg (ref 26.0–34.0)
MCHC: 33.9 g/dL (ref 30.0–36.0)
MCV: 82.6 fL (ref 78.0–100.0)
PLATELETS: 250 10*3/uL (ref 150–400)
RBC: 4.61 MIL/uL (ref 3.87–5.11)
RDW: 12.9 % (ref 11.5–15.5)
WBC: 7.7 10*3/uL (ref 4.0–10.5)

## 2014-07-14 LAB — BASIC METABOLIC PANEL
ANION GAP: 12 (ref 5–15)
BUN: 7 mg/dL (ref 6–23)
CALCIUM: 9.2 mg/dL (ref 8.4–10.5)
CO2: 22 mEq/L (ref 19–32)
Chloride: 104 mEq/L (ref 96–112)
Creatinine, Ser: 0.74 mg/dL (ref 0.50–1.10)
Glucose, Bld: 101 mg/dL — ABNORMAL HIGH (ref 70–99)
Potassium: 3.5 mEq/L — ABNORMAL LOW (ref 3.7–5.3)
SODIUM: 138 meq/L (ref 137–147)

## 2014-07-14 LAB — I-STAT TROPONIN, ED: TROPONIN I, POC: 0 ng/mL (ref 0.00–0.08)

## 2014-07-14 MED ORDER — GUAIFENESIN-DM 100-10 MG/5ML PO SYRP: 5.0000 mL | ORAL_SOLUTION | ORAL | Status: DC | PRN

## 2014-07-14 MED ORDER — TRAMADOL HCL 50 MG PO TABS: 50.0000 mg | ORAL_TABLET | Freq: Four times a day (QID) | ORAL | Status: DC | PRN

## 2014-07-14 MED ORDER — IBUPROFEN 600 MG PO TABS: 600.0000 mg | ORAL_TABLET | Freq: Four times a day (QID) | ORAL | Status: DC | PRN

## 2014-07-14 NOTE — ED Notes (Signed)
Pt reports cough and chest congestion for several days and today is worse causing more chest pain and sob. Denies fevers, nv.

## 2014-07-14 NOTE — ED Provider Notes (Signed)
CSN: [id number]     Arrival date & time 07/14/14  0109 History   First MD Initiated Contact with Patient 07/14/14 0157     Chief Complaint  Patient presents with  . Cough  . Chest Pain     (Consider location/radiation/quality/duration/timing/severity/associated sxs/prior Treatment) HPI Generally healthy 23 yo woman comes in with productive cough x 1 week. Patient says she has left sided chest pain with coughing but, only with coughing. Her cough is mildly productive of yellow sputum. She denies fever, wheezing and SOB. She is a nonsmoker.   Her chest discomfort associated with cough is mild and aching and nonradiating.    Past Medical History  Diagnosis Date  . Acid reflux    History reviewed. No pertinent past surgical history. No family history on file. History  Substance Use Topics  . Smoking status: Never Smoker   . Smokeless tobacco: Not on file  . Alcohol Use: Yes     Comment: social   OB History   Grav Para Term Preterm Abortions TAB SAB Ect Mult Living                 Review of Systems  10 point review of symptoms obtained and is negative with the exceptions of symptoms noted abov.e    Allergies  Doxycycline and Latex  Home Medications   Prior to Admission medications   Medication Sig Start Date End Date Taking? Authorizing Provider  cyclobenzaprine (FLEXERIL) 10 MG tablet Take 1 tablet (10 mg total) by mouth at bedtime. 04/16/14   Mellody DrownLauren Parker, PA-C  naproxen (NAPROSYN) 500 MG tablet Take 1 tablet (500 mg total) by mouth 2 (two) times daily. Take with food 04/16/14   Mellody DrownLauren Parker, PA-C  omeprazole (PRILOSEC) 10 MG capsule Take 10 mg by mouth daily as needed (indigestion).    Historical Provider, MD   BP 114/73  Pulse 74  Temp(Src) 98 F (36.7 C) (Oral)  Resp 14  Ht 5\' 4"  (1.626 m)  Wt 130 lb (58.968 kg)  BMI 22.30 kg/m2  SpO2 100% Physical Exam Gen: well nourished and well developed appearing Head: NCAT Ears: normal to inspection Nose: normal to  inspection, no epistaxis or drainage Mouth: oral mucsoa is well hydrated appearing, normal posterior oropharynx Neck: supple, no stridor Chest wall: left chest wall is tender to palpation with reproduction of pain. There is also reproduction of pain with internal and external rotation of the left shoulder.  CV: RRR, no murmur, palpable peripheral pulses Resp: lung sounds are clear to auscultation bilaterally, no wheeing or rhonchi or rales, normal respiratory effort.  Abd: soft, nontender, nondistended Extremities: normal to inspection.  Skin: warm and dry Neuro: CN ii - XII, no focal deficitis Psyche; normal affect, cooperative.   ED Course  Procedures (including critical care time) Labs Review  Results for orders placed during the hospital encounter of 07/14/14 (from the past 24 hour(s))  CBC     Status: None   Collection Time    07/14/14  1:20 AM      Result Value Ref Range   WBC 7.7  4.0 - 10.5 K/uL   RBC 4.61  3.87 - 5.11 MIL/uL   Hemoglobin 12.9  12.0 - 15.0 g/dL   HCT 40.938.1  81.136.0 - 91.446.0 %   MCV 82.6  78.0 - 100.0 fL   MCH 28.0  26.0 - 34.0 pg   MCHC 33.9  30.0 - 36.0 g/dL   RDW 78.212.9  95.611.5 - 21.315.5 %  Platelets 250  150 - 400 K/uL  BASIC METABOLIC PANEL     Status: Abnormal   Collection Time    07/14/14  1:20 AM      Result Value Ref Range   Sodium 138  137 - 147 mEq/L   Potassium 3.5 (*) 3.7 - 5.3 mEq/L   Chloride 104  96 - 112 mEq/L   CO2 22  19 - 32 mEq/L   Glucose, Bld 101 (*) 70 - 99 mg/dL   BUN 7  6 - 23 mg/dL   Creatinine, Ser 2.95  0.50 - 1.10 mg/dL   Calcium 9.2  8.4 - 62.1 mg/dL   GFR calc non Af Amer >90  >90 mL/min   GFR calc Af Amer >90  >90 mL/min   Anion gap 12  5 - 15  I-STAT TROPOININ, ED     Status: None   Collection Time    07/14/14  1:25 AM      Result Value Ref Range   Troponin i, poc 0.00  0.00 - 0.08 ng/mL   Comment 3            EKG: nsr, no acute ischemic changes, normal intervals, normal axis, normal qrs complex  MDM   I believe  this patient has bronchitis with chest wall pain. I do not suspect pneumonia, pneumothorax, pleural effusion, pericarditis or pulmonary embolus. Patient stable for discharge plan for symptomatic management and outpatient followup.    Brandt Loosen, MD 07/19/14 [phone]

## 2014-07-14 NOTE — Discharge Instructions (Signed)

## 2014-10-08 ENCOUNTER — Encounter (HOSPITAL_COMMUNITY): Payer: Self-pay | Admitting: *Deleted

## 2014-10-08 ENCOUNTER — Emergency Department (HOSPITAL_COMMUNITY)
Admission: EM | Admit: 2014-10-08 | Discharge: 2014-10-08 | Disposition: A | Discharge status: Home / Self Care | Attending: Emergency Medicine | Admitting: Emergency Medicine

## 2014-10-08 ENCOUNTER — Emergency Department (HOSPITAL_COMMUNITY)

## 2014-10-08 DIAGNOSIS — J0100 Acute maxillary sinusitis, unspecified: Secondary | ICD-10-CM | POA: Insufficient documentation

## 2014-10-08 DIAGNOSIS — Z8719 Personal history of other diseases of the digestive system: Secondary | ICD-10-CM | POA: Insufficient documentation

## 2014-10-08 DIAGNOSIS — H9209 Otalgia, unspecified ear: Secondary | ICD-10-CM | POA: Insufficient documentation

## 2014-10-08 DIAGNOSIS — Y998 Other external cause status: Secondary | ICD-10-CM | POA: Insufficient documentation

## 2014-10-08 DIAGNOSIS — R05 Cough: Secondary | ICD-10-CM | POA: Insufficient documentation

## 2014-10-08 DIAGNOSIS — R040 Epistaxis: Secondary | ICD-10-CM | POA: Insufficient documentation

## 2014-10-08 DIAGNOSIS — Z91040 Latex allergy status: Secondary | ICD-10-CM | POA: Insufficient documentation

## 2014-10-08 DIAGNOSIS — W228XXA Striking against or struck by other objects, initial encounter: Secondary | ICD-10-CM | POA: Insufficient documentation

## 2014-10-08 DIAGNOSIS — S60212A Contusion of left wrist, initial encounter: Secondary | ICD-10-CM

## 2014-10-08 DIAGNOSIS — Y9289 Other specified places as the place of occurrence of the external cause: Secondary | ICD-10-CM | POA: Insufficient documentation

## 2014-10-08 DIAGNOSIS — J029 Acute pharyngitis, unspecified: Secondary | ICD-10-CM | POA: Insufficient documentation

## 2014-10-08 DIAGNOSIS — Y9389 Activity, other specified: Secondary | ICD-10-CM | POA: Insufficient documentation

## 2014-10-08 LAB — RAPID STREP SCREEN (MED CTR MEBANE ONLY): Streptococcus, Group A Screen (Direct): NEGATIVE

## 2014-10-08 MED ORDER — AMOXICILLIN-POT CLAVULANATE 875-125 MG PO TABS: 1.0000 | ORAL_TABLET | Freq: Two times a day (BID) | ORAL | Status: DC

## 2014-10-08 MED ORDER — NAPROXEN 500 MG PO TABS: 500.0000 mg | ORAL_TABLET | Freq: Two times a day (BID) | ORAL | Status: DC

## 2014-10-08 MED ORDER — IBUPROFEN 400 MG PO TABS
600.0000 mg | ORAL_TABLET | Freq: Once | ORAL | Status: AC
  Administered 2014-10-08: 600 mg via ORAL
  Filled 2014-10-08 (×2): qty 1

## 2014-10-08 NOTE — ED Notes (Signed)
Pt reports sinus HA  With a sore throat . Pt also reports Lt wrist and Lt thumb pain.

## 2014-10-08 NOTE — ED Provider Notes (Signed)
CSN: [id number]     Arrival date & time 10/08/14  0711 History   First MD Initiated Contact with Patient 10/08/14 [phone]     Chief Complaint  Patient presents with  . Wrist Pain  . Sore Throat  . Facial Pain     (Consider location/radiation/quality/duration/timing/severity/associated sxs/prior Treatment) HPI Pt is a 23yo female presenting to ED c/o sore throat, nasal congestion, facial pain, and bilateral ear pain x10 days, gradually worsening w/o relief of ibuprofen or mucinex.  Pt states she thought it was just a cold but symptoms will not improve.  Pt c/o aching and sore facial pain, worse below her eyes, moderate in severity.  Pt also report small nose bleeds that have been intermittent last few days.  Denies fever, n/v/d. 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    Pt also c/o left sided wrist pain after accidentally hitting it on a table yesterday. Pain is constant, aching and sore, worse with movement, 7/10 at worst. Reports associated mild bruising and swelling. States she took ibuprofen with moderate relief.    Past Medical History  Diagnosis Date  . Acid reflux    History reviewed. No pertinent past surgical history. History reviewed. No pertinent family history. History  Substance Use Topics  . Smoking status: Never Smoker   . Smokeless tobacco: Not on file  . Alcohol Use: Yes     Comment: social   OB History    No data available     Review of Systems  Constitutional: Negative for fever, chills and appetite change.  HENT: Positive for congestion, ear pain ( bilateral), nosebleeds, postnasal drip, rhinorrhea, sinus pressure and sore throat. Negative for facial swelling, trouble swallowing and voice change.   Respiratory: Positive for cough. Negative for shortness of breath.   Gastrointestinal: Negative for nausea, vomiting and abdominal pain.  Musculoskeletal: Positive for myalgias, joint swelling and arthralgias.    Left wrist  Skin: Positive for color change. Negative for wound.  All other systems reviewed and are negative.     Allergies  Doxycycline and Latex  Home Medications   Prior to Admission medications   Medication Sig Start Date End Date Taking? Authorizing Provider  ibuprofen (ADVIL,MOTRIN) 200 MG tablet Take 400 mg by mouth every 6 (six) hours as needed for headache.   Yes Historical Provider, MD  amoxicillin-clavulanate (AUGMENTIN) 875-125 MG per tablet Take 1 tablet by mouth every 12 (twelve) hours. 10/08/14   Junius FinnerErin O'Malley, PA-C  guaiFENesin-dextromethorphan (ROBITUSSIN DM) 100-10 MG/5ML syrup Take 5 mLs by mouth every 4 (four) hours as needed for cough. 07/14/14   Brandt LoosenJulie Manly, MD  ibuprofen (ADVIL,MOTRIN) 600 MG tablet Take 1 tablet (600 mg total) by mouth every 6 (six) hours as needed. 07/14/14   Brandt LoosenJulie Manly, MD  naproxen (NAPROSYN) 500 MG tablet Take 1 tablet (500 mg total) by mouth 2 (two) times daily with a meal. 10/08/14   Junius FinnerErin O'Malley, PA-C  traMADol (ULTRAM) 50 MG tablet Take 1 tablet (50 mg total) by mouth every 6 (six) hours as needed. 07/14/14   Brandt LoosenJulie Manly, MD   BP 119/66 mmHg  Pulse 83  Temp(Src) 98.9 F (37.2 C) (Oral)  Resp 16  Ht 5\' 4"  (1.626 m)  Wt 130 lb (58.968 kg)  BMI 22.30 kg/m2  SpO2 100% Physical Exam  Constitutional: She appears well-developed and well-nourished. No distress.  HENT:  Head: Normocephalic and atraumatic.  Right Ear: Hearing, tympanic membrane, external  ear and ear canal normal.  Left Ear: Hearing, tympanic membrane, external ear and ear canal normal.  Nose: Mucosal edema present. Right sinus exhibits maxillary sinus tenderness. Right sinus exhibits no frontal sinus tenderness. Left sinus exhibits maxillary sinus tenderness. Left sinus exhibits no frontal sinus tenderness.  Mouth/Throat: Uvula is midline. Posterior oropharyngeal edema and posterior oropharyngeal erythema present. No oropharyngeal exudate.  Eyes: Conjunctivae are normal.  No scleral icterus.  Neck: Normal range of motion.  Cardiovascular: Normal rate, regular rhythm and normal heart sounds.   Pulses:      Radial pulses are 2+ on the left side.  Pulmonary/Chest: Effort normal and breath sounds normal. No respiratory distress. She has no wheezes. She has no rales. She exhibits no tenderness.  No respiratory distress, able to speak in full sentences w/o difficulty. Lungs: CTAB  Abdominal: Soft. Bowel sounds are normal. She exhibits no distension and no mass. There is no tenderness. There is no rebound and no guarding.  Musculoskeletal: Normal range of motion. She exhibits edema and tenderness.  Left wrist, radial aspect: mild edema with tenderness. FROM, 4/5 strength with resisted wrist flexion and extension. 5/5 grip strength bilaterally. No snuff-box tenderness.   Neurological: She is alert.  Left hand: sensation in tact.  Skin: Skin is warm and dry. She is not diaphoretic.  Left wrist, radial aspect: mild ecchymosis, skin in tact.  Nursing note and vitals reviewed.   ED Course  Procedures (including critical care time) Labs Review Labs Reviewed  RAPID STREP SCREEN  CULTURE, GROUP A STREP    Imaging Review Dg Wrist Complete Left  10/08/2014   CLINICAL DATA:  Pain following trauma ; hit wrist on solid object  EXAM: LEFT WRIST - COMPLETE 3+ VIEW  COMPARISON:  None.  FINDINGS: Frontal, oblique, lateral, and ulnar deviation scaphoid images were obtained. There is no fracture or dislocation. Joint spaces appear intact. No erosive change.  IMPRESSION: No fracture or dislocation.  No appreciable arthropathy.   Electronically Signed   By: Bretta BangWilliam  Woodruff M.D.   On: 10/08/2014 07:51   Dg Finger Thumb Left  10/08/2014   CLINICAL DATA:  Pain following trauma  EXAM: LEFT THUMB 2+V  COMPARISON:  None.  FINDINGS: Frontal, oblique, and lateral views were obtained. There is no fracture or dislocation. Joint spaces appear intact. No erosive change.  IMPRESSION: No  fracture or dislocation.  No appreciable arthropathy.   Electronically Signed   By: Bretta BangWilliam  Woodruff M.D.   On: 10/08/2014 07:50     EKG Interpretation None      MDM   Final diagnoses:  Acute maxillary sinusitis, recurrence not specified  Wrist contusion, left, initial encounter   Pt presenting to ED with signs and symptoms c/w sinusitis.  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  Rapid strep: negative.   Rx: augmentin.  Advised pt to use acetaminophen and ibuprofen as needed for fever and pain. Encouraged rest and fluids.  Left wrist and hand- neurovascularly in tact. Plain films: negative for acute injury. Will tx conservatively with ice and ibuprofen.     Return precautions provided. Pt verbalized understanding and agreement with tx plan.    Junius Finnerrin O'Malley, PA-C 10/08/14 [phone]  Purvis SheffieldForrest Harrison, MD 10/08/14 [phone]

## 2014-10-08 NOTE — ED Notes (Signed)
Declined W/C at D/C and was escorted to lobby by RN. 

## 2014-10-10 LAB — CULTURE, GROUP A STREP

## 2014-10-30 ENCOUNTER — Encounter (HOSPITAL_COMMUNITY): Payer: Self-pay | Admitting: *Deleted

## 2014-10-30 ENCOUNTER — Emergency Department (HOSPITAL_COMMUNITY)
Admission: EM | Admit: 2014-10-30 | Discharge: 2014-10-30 | Disposition: A | Discharge status: Home / Self Care | Attending: Emergency Medicine | Admitting: Emergency Medicine

## 2014-10-30 DIAGNOSIS — R52 Pain, unspecified: Secondary | ICD-10-CM | POA: Insufficient documentation

## 2014-10-30 DIAGNOSIS — J069 Acute upper respiratory infection, unspecified: Secondary | ICD-10-CM

## 2014-10-30 DIAGNOSIS — Z793 Long term (current) use of hormonal contraceptives: Secondary | ICD-10-CM | POA: Insufficient documentation

## 2014-10-30 DIAGNOSIS — Z792 Long term (current) use of antibiotics: Secondary | ICD-10-CM | POA: Insufficient documentation

## 2014-10-30 DIAGNOSIS — Z8719 Personal history of other diseases of the digestive system: Secondary | ICD-10-CM | POA: Insufficient documentation

## 2014-10-30 DIAGNOSIS — Z791 Long term (current) use of non-steroidal anti-inflammatories (NSAID): Secondary | ICD-10-CM | POA: Insufficient documentation

## 2014-10-30 DIAGNOSIS — Z91040 Latex allergy status: Secondary | ICD-10-CM | POA: Insufficient documentation

## 2014-10-30 NOTE — ED Notes (Signed)
The pt has had body aches cold chills for 2 days  lmp none depo

## 2014-10-30 NOTE — Discharge Instructions (Signed)
°Emergency Department Resource Guide °1) Find a Doctor and Pay Out of Pocket °Although you won't have to find out who is covered by your insurance plan, it is a good idea to ask around and get recommendations. You will then need to call the office and see if the doctor you have chosen will accept you as a new patient and what types of options they offer for patients who are self-pay. Some doctors offer discounts or will set up payment plans for their patients who do not have insurance, but you will need to ask so you aren't surprised when you get to your appointment. ° °2) Contact Your Local Health Department °Not all health departments have doctors that can see patients for sick visits, but many do, so it is worth a call to see if yours does. If you don't know where your local health department is, you can check in your phone book. The CDC also has a tool to help you locate your state's health department, and many state websites also have listings of all of their local health departments. ° °3) Find a Walk-in Clinic °If your illness is not likely to be very severe or complicated, you may want to try a walk in clinic. These are popping up all over the country in pharmacies, drugstores, and shopping centers. They're usually staffed by nurse practitioners or physician assistants that have been trained to treat common illnesses and complaints. They're usually fairly quick and inexpensive. However, if you have serious medical issues or chronic medical problems, these are probably not your best option. ° °No Primary Care Doctor: °- Call [facility] at  832-8000 - they can help you locate a primary care doctor that  accepts your insurance, provides certain services, etc. °- Physician Referral Service- 1-800-533-3463 ° °Chronic Pain Problems: °Organization         Address  Phone   Notes  °[facility]  (336) 297-2271 Patients need to be referred by their primary care doctor.  ° °Medication  Assistance: °Organization         Address  Phone   Notes  °[place] [facility] [address] °[place], [place] [address] (336) 641-8030 --Must be a resident of [place] °-- Must have NO insurance coverage whatsoever (no Medicaid/ Medicare, etc.) °-- The pt. MUST have a primary care doctor that directs their care regularly and follows them in the community °  °[facility]  (866) 331-1348   °[employer]  (888) 892-1162   ° °Agencies that provide inexpensive medical care: °Organization         Address  Phone   Notes  °[facility]  (336) 832-8035   °[facility]    (336) 832-7272   °[facility] [address] °[place], [place] [address] (336) 832-4777   °[facility] [address], °[place] (336) 271-4999   °[facility]    (336) 373-0678   °[facility]    (336) 272-1050   °[facility] ° [address], [place] Phone:  (336) 832-4444, Fax:  (336) 832-4440 Hours of Operation:  9 am - 6 pm, M-F.  Also accepts Medicaid/Medicare and self-pay.  °[facility] ° [address], [place] Phone: (336) 832-3150, Fax: (336) [phone]. Hours of Operation:  8:30 am - 5:30 pm, M-F.  Also accepts Medicaid and self-pay.  °[facility] [place] [address], [place] Phone: (336) 878-6027   °[facility] [address], [place], [place] (336)723-1848, Ext. 123 Mondays & Thursdays: 7-9 AM.  First 15 patients are seen on a first come, first serve basis. °  ° °Medicaid-accepting [place] Providers: ° °Organization         Address  Phone   Notes  °[facility] [address], [place] (336) 641-2100 Also accepts self-pay patients.  °[facility] [address], [place] ° (336) 856-9996   °[facility] [address], [place]  (336) 288-8857   °[facility] [address] [place] [address], [place] (336) 299-7000   °Veita Bland [address], [place]  ° (336) 373-1557 Only accepts [place] Access Medicaid patients after they have their name applied to their card.  ° °Self-Pay (no insurance) in [place]: ° °Organization         Address  Phone   Notes  °Sickle Cell Patients, [facility] [address], [place] (336) 832-1970   °[facility] [address], [place] (336) 832-4400   °[facility] ° [address], [place] (336) 992-4800   °[facility]/Dr. Osei-Bonsu ° [address] [place] [address], [place] or [address], [place] (336) 841-8500 Phone number for both [place] and [place] locations is the same.  °[facility] [address], [place] (336) 299-0000   °[facility] [address] [place] [address], [place] or [address] (336) 852-7530 °(336) 878-2260   °[facility] [address], [place] (336) 350-1642, phone; (336) 294-5005, fax Sees patients 1st and 3rd Saturday of every month.  Must not qualify for public or private insurance (i.e. Medicaid, Medicare, [place] Health Choice, Veterans' Benefits) • Household income should be no more than 200% of the poverty level •The clinic cannot treat you if you are pregnant or think you are pregnant • Sexually transmitted diseases are not treated at the clinic.  ° ° °Dental Care: °Organization         Address  Phone  Notes  °[place] [facility] [address], [place] (336) 641-6152 Accepts children up to age 21 who are enrolled in Medicaid or [place] Health Choice; pregnant women with a Medicaid card; and children who have applied for Medicaid or [place] Health Choice, but were declined, whose parents can pay a reduced fee at time of service.  °[place]  [facility] [place]  [address], [place] (336) 641-7733 Accepts children up to age 21 who are enrolled in Medicaid or [place] Health Choice; pregnant women with a Medicaid card; and children who have applied for Medicaid or [place] Health Choice, but were declined, whose parents can pay a reduced fee at time of service.  °[facility] ° [address], [place] (336) 641-4533 Patients are seen by appointment only. Walk-ins are not accepted. [facility] will see patients 18 years of age and older. °Monday - Tuesday (8am-5pm) °Most Wednesdays (8:30-5pm) °$30 per visit, cash only  °[facility] ° [address], [place] (336) 641-4533 Patients are seen by appointment only. Walk-ins are not accepted. [facility] will see patients 18 years of age and older. °One   Wednesday Evening (Monthly: Volunteer Based).  $30 per visit, cash only  °[facility]  (919) 537-3737 for adults; Children under age 4, call Graduate Pediatric Dentistry at ([phone]) [phone]. Children aged 4-14, please call ([phone]) [phone] to request a pediatric application. ° Dental services are provided in all areas of dental care including fillings, crowns and bridges, complete and partial dentures, implants, gum treatment, root canals, and extractions. Preventive care is also provided. Treatment is provided to both adults and children. °Patients are selected via a lottery and there is often a waiting list. °  °[facility] [address], °[place] ° (336) 763-8833 [email or web address] °  °[facility] [address], [place], [place] (336)723-1848, Ext. 123 Second and Fourth Thursday of each month, opens at 6:30 AM; Clinic ends at 9 AM.  Patients are seen on a first-come first-served basis, and a limited number are seen during each clinic.  ° °[facility] ° [address], [place], [place] (336) 723-7904    Eligibility Requirements °You must have lived in [place], [place], or [place] counties for at least the last three months. °  You cannot be eligible for state or federal sponsored healthcare insurance, including Veterans Administration, Medicaid, or Medicare. °  You generally cannot be eligible for healthcare insurance through your employer.  °  How to apply: °Eligibility screenings are held every Tuesday and Wednesday afternoon from 1:00 pm until 4:00 pm. You do not need an appointment for the interview!  °[facility] [address], [place], [place] 336-631-2330   °[facility]  336-342-8273   °[facility]  336-703-3100   °[facility]  336-570-6415   ° °Behavioral Health Resources in the Community: °Intensive Outpatient Programs °Organization         Address  Phone  Notes  °[facility] [address], [place], [place] 336-878-6098   °[facility] [address], [place], [place] 336-832-9800   °[facility] [address], [place], [place] ° 336-882-2125   °[facility] [address],  °[place], [place] 1-800-853-5163 or 336-641-4981   °Substance Abuse Resources °Organization         Address  Phone  Notes  °[facility]  336-882-2125   °[facility]  336-784-9470   °[facility]  336-285-9073   °[facility]  336-845-3988   °Residential & Outpatient Substance Abuse Program  1-800-659-3381   °Psychological Services °Organization         Address  Phone  Notes  °[facility]  336- 832-9600   °[facility]  336- 378-7881   °[facility] [address], [place] 1-800-853-5163 or 336-641-4981   ° °Mobile Crisis Teams °Organization         Address  Phone  Notes  °[facility], Mobile Crisis Care Unit  1-877-626-1772   °Assertive °[facility] ° [address]  [place], [place] 336-834-9664   °Sharon DeEsch [address] °[place] 336-554-5454   ° °Self-Help/Support Groups °Organization         Address  Phone             Notes  °[facility]  - variety of support groups  336- 373-1402 Call for more information  °Narcotics Anonymous (NA), [facility] [address], °[place]  2 meetings at this location  ° °  Residential Treatment Programs Organization         Address  Phone  Notes  [facility] [address],    [place]  [phone]   [facility]  [address], [place] [phone], [place], [place] [phone]) [phone]   [facility] [address], [place] [phone] Admissions: 8am-3pm M-F  [facility] [address]    [place], [place] [phone]   [facility] [address], [place], [place] [phone]   [facility] [address]  [place], [place] [phone]   [facility] Intensive Outpatient [address], [place], [place] [phone]   [facility] ([facility] [address]  [place], [place] [phone] or [phone]   [facility] [address] [place], [place] [phone] Accepts Medicaid  [facility] [address]  [place] [phone] Substance Abuse/Addiction Treatment   [facility]         Address  Phone  Notes  [facility]  [phone](888) ([phone]   Angie FavaJulie Brannon, PhD [address], [address] [place], [place]   [phone](336) [phone] or [phone]   [facility]   [address] [place], [place] [phone](336) [phone]   [facility] [address], [place], [place] [phone](336) [phone] Insurance/Medicaid/sponsorship through [facility] [address]                                    [place], [place] [phone]) [phone] Therapy/tele-psych/case    [facility] [address]   [place], [place] [phone]) [phone]    Dr. Lolly MustacheArfeen  [phone]) [phone]   [facility]  [employer] [facility] 1) [address], [place] 2) [address], [place] 3)  [address], [place] [phone](336) [phone](336) [phone]  [phone](336) [phone]   [facility] Child Abuse Hotline [phone](336) [phone] or [phone](336) [phone] (After Hours)      Take over the counter tylenol and ibuprofen, as directed on packaging, as needed for discomfort.  Take over the counter decongestant (such as sudafed), as directed on packaging, for the next week.  Use over the counter normal saline nasal spray, as instructed in the Emergency Department, several times per day for the next 2 weeks.  Call your regular medical doctor today to schedule a follow up appointment this week.  Return to the Emergency Department immediately if worsening.

## 2014-10-30 NOTE — ED Notes (Signed)
PT ambulated with baseline gait; VSS; A&Ox3; no signs of distress; respirations even and unlabored; skin warm and dry; no questions upon discharge.  

## 2014-10-30 NOTE — ED Provider Notes (Signed)
CSN: [id number]     Arrival date & time 10/30/14  0650 History   First MD Initiated Contact with Patient 10/30/14 [phone]     Chief Complaint  Patient presents with  . multiple complaints       HPI Pt was seen at 0705.  Per pt, c/o gradual onset and persistence of constant runny/stuffy nose, ears congestion and sinus congestion that began yesterday.  States she woke up today with generalized body aches. Pt has been taking OTC "cold medicine" without improvement in her symptoms.  Denies sore throat, no objective fevers, no rash, no CP/SOB, no N/V/D, no abd pain.     Past Medical History  Diagnosis Date  . Acid reflux    History reviewed. No pertinent past surgical history.  History  Substance Use Topics  . Smoking status: Never Smoker   . Smokeless tobacco: Not on file  . Alcohol Use: Yes     Comment: social    Review of Systems ROS: Statement: All systems negative except as marked or noted in the HPI; Constitutional: Negative for fever and chills. +generalized body aches.; ; Eyes: Negative for eye pain, redness and discharge. ; ; ENMT: Negative for ear pain, hoarseness, sore throat. +ears congestion, nasal congestion, rhinorrhea, sinus pressure. ; ; Cardiovascular: Negative for chest pain, palpitations, diaphoresis, dyspnea and peripheral edema. ; ; Respiratory: Negative for cough, wheezing and stridor. ; ; Gastrointestinal: Negative for nausea, vomiting, diarrhea, abdominal pain, blood in stool, hematemesis, jaundice and rectal bleeding. . ; ; Genitourinary: Negative for dysuria, flank pain and hematuria. ; ; Musculoskeletal: Negative for back pain and neck pain. Negative for swelling and trauma.; ; Skin: Negative for pruritus, rash, abrasions, blisters, bruising and skin lesion.; ; Neuro: Negative for headache, lightheadedness and neck stiffness. Negative for weakness, altered level of consciousness , altered mental status, extremity weakness, paresthesias, involuntary movement, seizure  and syncope.      Allergies  Doxycycline and Latex  Home Medications   Prior to Admission medications   Medication Sig Start Date End Date Taking? Authorizing Provider  medroxyPROGESTERone (DEPO-SUBQ PROVERA 104) 104 MG/0.65ML injection Inject 104 mg into the skin every 3 (three) months.   Yes Historical Provider, MD  Phenyleph-CPM-DM-APAP (TYLENOL COLD MULTI-SYMPTOM PO) Take 1-2 capsules by mouth every 6 (six) hours as needed (sinus pain and pressure).   Yes Historical Provider, MD  amoxicillin-clavulanate (AUGMENTIN) 875-125 MG per tablet Take 1 tablet by mouth every 12 (twelve) hours. 10/08/14   Junius FinnerErin O'Malley, PA-C  guaiFENesin-dextromethorphan (ROBITUSSIN DM) 100-10 MG/5ML syrup Take 5 mLs by mouth every 4 (four) hours as needed for cough. 07/14/14   Brandt LoosenJulie Manly, MD  ibuprofen (ADVIL,MOTRIN) 200 MG tablet Take 200-800 mg by mouth every 6 (six) hours as needed for headache, moderate pain or cramping.     Historical Provider, MD  ibuprofen (ADVIL,MOTRIN) 600 MG tablet Take 1 tablet (600 mg total) by mouth every 6 (six) hours as needed. 07/14/14   Brandt LoosenJulie Manly, MD  naproxen (NAPROSYN) 500 MG tablet Take 1 tablet (500 mg total) by mouth 2 (two) times daily with a meal. 10/08/14   Junius FinnerErin O'Malley, PA-C  traMADol (ULTRAM) 50 MG tablet Take 1 tablet (50 mg total) by mouth every 6 (six) hours as needed. 07/14/14   Brandt LoosenJulie Manly, MD   BP 110/61 mmHg  Pulse 96  Temp(Src) 99.1 F (37.3 C) (Oral)  Resp 17  Ht 5\' 4"  (1.626 m)  Wt 130 lb (58.968 kg)  BMI 22.30 kg/m2  SpO2 100% Physical  Exam  0710: Physical examination:  Nursing notes reviewed; Vital signs and O2 SAT reviewed;  Constitutional: Well developed, Well nourished, Well hydrated, In no acute distress; Head:  Normocephalic, atraumatic; Eyes: EOMI, PERRL, No scleral icterus; ENMT: TM's clear bilat. +edemetous nasal turbinates bilat with clear rhinorrhea. Mouth and pharynx without lesions. No tonsillar exudates. No intra-oral edema. No  submandibular or sublingual edema. No hoarse voice, no drooling, no stridor. No pain with manipulation of larynx. No trismus. Mouth and pharynx normal, Mucous membranes moist; Neck: Supple, Full range of motion, No lymphadenopathy. No meningeal signs.; Cardiovascular: Regular rate and rhythm, No murmur, rub, or gallop; Respiratory: Breath sounds clear & equal bilaterally, No rales, rhonchi, wheezes.  Speaking full sentences with ease, Normal respiratory effort/excursion; Chest: Nontender, Movement normal; Abdomen: Soft, Nontender, Nondistended, Normal bowel sounds; Genitourinary: No CVA tenderness; Extremities: Pulses normal, No tenderness, No edema, No calf edema or asymmetry.; Neuro: AA&Ox3, Major CN grossly intact.  Speech clear. No gross focal motor or sensory deficits in extremities. Climbs on and off stretcher easily by herself. Gait steady.; Skin: Color normal, Warm, Dry.   ED Course  Procedures     MDM  MDM Reviewed: previous chart, nursing note and vitals Reviewed previous: labs     0715:  Appears URI at this time; tx symptomatically. At the end of the visit, pt stated she came to the ED needing a work note. Dx d/w pt.  Questions answered.  Verb understanding, agreeable to d/c home with outpt f/u.   Samuel JesterKathleen Dsean Vantol, DO 11/02/14 1214

## 2014-12-08 ENCOUNTER — Inpatient Hospital Stay (HOSPITAL_COMMUNITY)
Admission: AD | Admit: 2014-12-08 | Discharge: 2014-12-08 | Disposition: A | Discharge status: Home / Self Care | Source: Ambulatory Visit | Attending: Family Medicine | Admitting: Family Medicine

## 2014-12-08 ENCOUNTER — Encounter (HOSPITAL_COMMUNITY): Payer: Self-pay | Admitting: *Deleted

## 2014-12-08 DIAGNOSIS — F17210 Nicotine dependence, cigarettes, uncomplicated: Secondary | ICD-10-CM | POA: Insufficient documentation

## 2014-12-08 DIAGNOSIS — A5901 Trichomonal vulvovaginitis: Secondary | ICD-10-CM | POA: Insufficient documentation

## 2014-12-08 HISTORY — DX: Unspecified abnormal cytological findings in specimens from vagina: R87.629

## 2014-12-08 HISTORY — DX: Irritable bowel syndrome without diarrhea: K58.9

## 2014-12-08 LAB — WET PREP, GENITAL
CLUE CELLS WET PREP: NONE SEEN
YEAST WET PREP: NONE SEEN

## 2014-12-08 MED ORDER — METRONIDAZOLE 500 MG PO TABS: ORAL_TABLET | ORAL | Status: DC

## 2014-12-08 NOTE — MAU Note (Signed)
Pt c.o unnoticeable rash on inner thighs and itching for past four days on labia.  Pt has not noticed any discharge.  Pt on DEPO.  Denies dysuria.

## 2014-12-08 NOTE — Discharge Instructions (Signed)

## 2014-12-08 NOTE — MAU Provider Note (Signed)
Chief Complaint: Rash   First Provider Initiated Contact with Patient 12/08/14 1753     SUBJECTIVE HPI: Courtney Martinez is a 24 y.o. nulligravida who presents with labial itching for several days. Appplied Desitin with no improvemen. Episode began with vaginal itching. No abnormal pain noted. No new sex partner. HIV done 4 days ago at [facility] where she got Depo. Requests GC/CT. No UTI sx.  Past Medical History  Diagnosis Date  . Acid reflux   . IBS (irritable bowel syndrome)   . Vaginal Pap smear, abnormal    OB History  No data available   Past Surgical History  Procedure Laterality Date  . No past surgeries     History   Social History  . Marital Status: Single    Spouse Name: N/A    Number of Children: N/A  . Years of Education: N/A   Occupational History  . Not on file.   Social History Main Topics  . Smoking status: Current Every Day Smoker -- 0.25 packs/day for 4 years    Types: Cigars  . Smokeless tobacco: Not on file  . Alcohol Use: Yes     Comment: social  . Drug Use: No  . Sexual Activity: Yes    Birth Control/ Protection: Injection   Other Topics Concern  . Not on file   Social History Narrative   No current facility-administered medications on file prior to encounter.   Current Outpatient Prescriptions on File Prior to Encounter  Medication Sig Dispense Refill  . ibuprofen (ADVIL,MOTRIN) 600 MG tablet Take 1 tablet (600 mg total) by mouth every 6 (six) hours as needed. 30 tablet 0  . medroxyPROGESTERone (DEPO-SUBQ PROVERA 104) 104 MG/0.65ML injection Inject 104 mg into the skin every 3 (three) months.    Marland Kitchen. amoxicillin-clavulanate (AUGMENTIN) 875-125 MG per tablet Take 1 tablet by mouth every 12 (twelve) hours. (Patient not taking: Reported on 12/08/2014) 14 tablet 0  . guaiFENesin-dextromethorphan (ROBITUSSIN DM) 100-10 MG/5ML syrup Take 5 mLs by mouth every 4 (four) hours as needed for cough. (Patient not taking: Reported on 12/08/2014) 118 mL 0  . ibuprofen  (ADVIL,MOTRIN) 200 MG tablet Take 200-800 mg by mouth every 6 (six) hours as needed for headache, moderate pain or cramping.     . naproxen (NAPROSYN) 500 MG tablet Take 1 tablet (500 mg total) by mouth 2 (two) times daily with a meal. (Patient not taking: Reported on 12/08/2014) 30 tablet 0  . Phenyleph-CPM-DM-APAP (TYLENOL COLD MULTI-SYMPTOM PO) Take 1-2 capsules by mouth every 6 (six) hours as needed (sinus pain and pressure).    . traMADol (ULTRAM) 50 MG tablet Take 1 tablet (50 mg total) by mouth every 6 (six) hours as needed. (Patient not taking: Reported on 12/08/2014) 15 tablet 0   Allergies  Allergen Reactions  . Doxycycline Nausea And Vomiting  . Latex     Patient stated just "spot" allergy on face    ROS: Pertinent items in HPI  OBJECTIVE Blood pressure 133/67, pulse 85, temperature 98.7 F (37.1 C), resp. rate 18, height 5\' 4"  (1.626 m), weight 64.411 kg (142 lb), SpO2 100 %. GENERAL: Well-developed, well-nourished female in no acute distress.  HEENT: Normocephalic HEART: normal rate RESP: normal effort ABDOMEN: Soft, non-tender EXTREMITIES: Nontender, no edema NEURO: Alert and oriented SPECULUM EXAM: NEFG, no rash or excoriation, moderate amount yellowish discharge, no blood noted, cervix clean BIMANUAL: cervix; uterus normal size, no adnexal tenderness or masses  LAB RESULTS Results for orders placed or performed during the  hospital encounter of 12/08/14 (from the past 24 hour(s))  Wet prep, genital     Status: Abnormal   Collection Time: 12/08/14  6:00 PM  Result Value Ref Range   Yeast Wet Prep HPF POC NONE SEEN NONE SEEN   Trich, Wet Prep MANY (A) NONE SEEN   Clue Cells Wet Prep HPF POC NONE SEEN NONE SEEN   WBC, Wet Prep HPF POC MANY (A) NONE SEEN    IMAGING No results found.  MAU COURSE  ASSESSMENT 1. Trichomonal vaginitis     PLAN Discharge home    Medication List    STOP taking these medications        amoxicillin-clavulanate 875-125 MG per  tablet  Commonly known as:  AUGMENTIN     guaiFENesin-dextromethorphan 100-10 MG/5ML syrup  Commonly known as:  ROBITUSSIN DM     naproxen 500 MG tablet  Commonly known as:  NAPROSYN     traMADol 50 MG tablet  Commonly known as:  ULTRAM     TYLENOL COLD MULTI-SYMPTOM PO      TAKE these medications        calcium carbonate 500 MG chewable tablet  Commonly known as:  TUMS - dosed in mg elemental calcium  Chew 2-8 tablets by mouth daily as needed for indigestion or heartburn.     ibuprofen 200 MG tablet  Commonly known as:  ADVIL,MOTRIN  Take 400 mg by mouth once.     ibuprofen 600 MG tablet  Commonly known as:  ADVIL,MOTRIN  Take 1 tablet (600 mg total) by mouth every 6 (six) hours as needed.     medroxyPROGESTERone 104 MG/0.65ML injection  Commonly known as:  DEPO-SUBQ PROVERA 104  Inject 104 mg into the skin every 3 (three) months.     metroNIDAZOLE 500 MG tablet  Commonly known as:  FLAGYL  Take all four tabs at once       Follow-up Information    Follow up with [facility] [place]. Schedule an appointment as soon as possible for a visit in 3 months.   Contact information:   [address] [facility] [address] [phone]      Danae Orleans, CNM 12/08/2014  5:55 PM

## 2014-12-08 NOTE — MAU Note (Addendum)
Started new body wash, rash on inner thigh and labia, x last Thursday, started day after new body wash, on DEPO LMP 5 yrs ago

## 2014-12-09 LAB — GC/CHLAMYDIA PROBE AMP
CT Probe RNA: POSITIVE — AB
GC Probe RNA: NEGATIVE

## 2014-12-18 ENCOUNTER — Encounter (HOSPITAL_COMMUNITY): Payer: Self-pay | Admitting: *Deleted

## 2014-12-18 ENCOUNTER — Emergency Department (HOSPITAL_COMMUNITY)
Admission: EM | Admit: 2014-12-18 | Discharge: 2014-12-18 | Disposition: A | Discharge status: Home / Self Care | Attending: Emergency Medicine | Admitting: Emergency Medicine

## 2014-12-18 DIAGNOSIS — Z79899 Other long term (current) drug therapy: Secondary | ICD-10-CM | POA: Insufficient documentation

## 2014-12-18 DIAGNOSIS — Z91040 Latex allergy status: Secondary | ICD-10-CM | POA: Insufficient documentation

## 2014-12-18 DIAGNOSIS — K219 Gastro-esophageal reflux disease without esophagitis: Secondary | ICD-10-CM | POA: Insufficient documentation

## 2014-12-18 DIAGNOSIS — A749 Chlamydial infection, unspecified: Secondary | ICD-10-CM | POA: Insufficient documentation

## 2014-12-18 DIAGNOSIS — Z720 Tobacco use: Secondary | ICD-10-CM | POA: Insufficient documentation

## 2014-12-18 DIAGNOSIS — Z792 Long term (current) use of antibiotics: Secondary | ICD-10-CM | POA: Insufficient documentation

## 2014-12-18 DIAGNOSIS — Z793 Long term (current) use of hormonal contraceptives: Secondary | ICD-10-CM | POA: Insufficient documentation

## 2014-12-18 DIAGNOSIS — Z791 Long term (current) use of non-steroidal anti-inflammatories (NSAID): Secondary | ICD-10-CM | POA: Insufficient documentation

## 2014-12-18 MED ORDER — AZITHROMYCIN 250 MG PO TABS
1000.0000 mg | ORAL_TABLET | Freq: Once | ORAL | Status: AC
  Administered 2014-12-18: 1000 mg via ORAL
  Filled 2014-12-18: qty 4

## 2014-12-18 NOTE — ED Notes (Signed)
Pt state she needs to be treated for chlamydia. Has appointment with health department on Tuesday but didn't want to wait that long.

## 2014-12-18 NOTE — ED Provider Notes (Signed)
CSN: [id number]     Arrival date & time 12/18/14  1944 History   First MD Initiated Contact with Patient 12/18/14 2041     Chief Complaint  Patient presents with  . SEXUALLY TRANSMITTED DISEASE     (Consider location/radiation/quality/duration/timing/severity/associated sxs/prior Treatment) HPI Comments: Patient is a 24 year old female presenting to the emergency department  Requesting treatment for positive chlamydia test. She states she was seen 1 week ago for vaginal irritation was diagnosed with trichomonas and treated. She states she was called back by the health department stating she had a positive Chlamydia test and required treatment. She states she hasn't appointment with health department on Tuesday but did not want to wait that long for treatment. She has no new complaints today. Denies any recent sexual intercourse since diagnosis.    Past Medical History  Diagnosis Date  . Acid reflux   . IBS (irritable bowel syndrome)   . Vaginal Pap smear, abnormal    Past Surgical History  Procedure Laterality Date  . No past surgeries     History reviewed. No pertinent family history. History  Substance Use Topics  . Smoking status: Current Every Day Smoker -- 0.25 packs/day for 4 years    Types: Cigars  . Smokeless tobacco: Not on file  . Alcohol Use: Yes     Comment: social   OB History    No data available     Review of Systems  All other systems reviewed and are negative.     Allergies  Doxycycline and Latex  Home Medications   Prior to Admission medications   Medication Sig Start Date End Date Taking? Authorizing Provider  calcium carbonate (TUMS - DOSED IN MG ELEMENTAL CALCIUM) 500 MG chewable tablet Chew 2-8 tablets by mouth daily as needed for indigestion or heartburn.    Historical Provider, MD  ibuprofen (ADVIL,MOTRIN) 200 MG tablet Take 400 mg by mouth once.    Historical Provider, MD  ibuprofen (ADVIL,MOTRIN) 600 MG tablet Take 1 tablet (600 mg total)  by mouth every 6 (six) hours as needed. 07/14/14   Brandt Loosen, MD  medroxyPROGESTERone (DEPO-SUBQ PROVERA 104) 104 MG/0.65ML injection Inject 104 mg into the skin every 3 (three) months.    Historical Provider, MD  metroNIDAZOLE (FLAGYL) 500 MG tablet Take all four tabs at once 12/08/14   Deirdre C Poe, CNM   BP 133/86 mmHg  Pulse 87  Temp(Src) 98 F (36.7 C) (Oral)  Resp 16  Ht  (1.626 m)  Wt 135 lb (61.236 kg)  BMI 23.16 kg/m2  SpO2 100% Physical Exam  Constitutional: She is oriented to person, place, and time. She appears well-developed and well-nourished. No distress.  HENT:  Head: Normocephalic and atraumatic.  Right Ear: External ear normal.  Left Ear: External ear normal.  Nose: Nose normal.  Mouth/Throat: Oropharynx is clear and moist.  Eyes: Conjunctivae are normal.  Neck: Normal range of motion. Neck supple.  Cardiovascular: Normal rate, regular rhythm and normal heart sounds.   Pulmonary/Chest: Effort normal and breath sounds normal.  Abdominal: Soft. Bowel sounds are normal. There is no tenderness.  Musculoskeletal: Normal range of motion.  Neurological: She is alert and oriented to person, place, and time.  Skin: Skin is warm and dry. She is not diaphoretic.  Psychiatric: She has a normal mood and affect.  Nursing note and vitals reviewed.   ED Course  Procedures (including critical care time) Medications  azithromycin (ZITHROMAX) tablet 1,000 mg (1,000 mg Oral Given 12/18/14 2052)  Labs Review Labs Reviewed - No data to display  Imaging Review No results found.   EKG Interpretation None      MDM   Final diagnoses:  Chlamydia infection    Filed Vitals:   12/18/14 2125  BP: 128/78  Pulse: 85  Temp:   Resp: 16   Afebrile, NAD, non-toxic appearing, AAOx4.  Abdomen soft, non-tender, non-distended. No pelvic complaints. Patient to be discharged with instructions to follow up with OBGYN. Patient was treated with Azithromycin for positive  Chlamydia test. Advised she will need to wait 10 days for the antibiotics to take affect. Discussed that because pt has had recent unprotected sex, might want to consider getting tested for HIV as well. Counseled pt that latex condoms are the only way to prevent against STDs or HIV. Return precautions discussed. Patient is agreeable to plan. Patient is stable at time of discharge          Jeannetta EllisJennifer L Lavaughn Haberle, PA-C 12/19/14 [id number]  Vanetta MuldersScott Zackowski, MD 12/23/14 1520

## 2014-12-18 NOTE — Discharge Instructions (Signed)
Please follow up with the Ob/Gyn to schedule a follow up appointment. Please allow 10 days to allow the antibiotic will deteriorate infection. Please read all discharge instructions and return precautions.    Chlamydia Chlamydia is an infection. It is spread through sexual contact. Chlamydia can be in different areas of the body. These areas include the cervix, urethra, throat, or rectum. You may not know you have chlamydia because many people never develop the symptoms. Chlamydia is not difficult to treat once you know you have it. However, if it is left untreated, chlamydia can lead to more serious health problems.  CAUSES  Chlamydia is caused by bacteria. It is a sexually transmitted disease. It is passed from an infected partner during intimate contact. This contact could be with the genitals, mouth, or rectal area. Chlamydia can also be passed from mothers to babies during birth. SIGNS AND SYMPTOMS  There may not be any symptoms. This is often the case early in the infection. If symptoms develop, they may include:  Mild pain and discomfort when urinating.  Redness, soreness, and swelling (inflammation) of the rectum.  Vaginal discharge.  Painful intercourse.  Abdominal pain.  Bleeding between menstrual periods. DIAGNOSIS  To diagnose this infection, your health care provider will do a pelvic exam. Cultures will be taken of the vagina, cervix, urine, and possibly the rectum to verify the diagnosis.  TREATMENT You will be given antibiotic medicines. If you are pregnant, certain types of antibiotics will need to be avoided. Any sexual partners should also be treated, even if they do not show symptoms.  HOME CARE INSTRUCTIONS   Take your antibiotic medicine as directed by your health care provider. Finish the antibiotic even if you start to feel better.  Take medicines only as directed by your health care provider.  Inform any sexual partners about the infection. They should also be  treated.  Do not have sexual contact until your health care provider tells you it is okay.  Get plenty of rest.  Eat a well-balanced diet.  Drink enough fluids to keep your urine clear or pale yellow.  Keep all follow-up visits as directed by your health care provider. SEEK MEDICAL CARE IF:  You have painful urination.  You have abdominal pain.  You have vaginal discharge.  You have painful sexual intercourse.  You have bleeding between periods and after sex.  You have a fever. SEEK IMMEDIATE MEDICAL CARE IF:   You experience nausea or vomiting.  You experience excessive sweating (diaphoresis).  You have difficulty swallowing. MAKE SURE YOU:   Understand these instructions.  Will watch your condition.  Will get help right away if you are not doing well or get worse. Document Released: 08/24/2005 Document Revised: 03/31/2014 Document Reviewed: 07/22/2013 [facility] Patient Information 2015 [place], [place]. This information is not intended to replace advice given to you by your health care provider. Make sure you discuss any questions you have with your health care provider.

## 2015-02-10 ENCOUNTER — Emergency Department (HOSPITAL_COMMUNITY)
Admission: EM | Admit: 2015-02-10 | Discharge: 2015-02-10 | Disposition: A | Discharge status: Home / Self Care | Attending: Emergency Medicine | Admitting: Emergency Medicine

## 2015-02-10 ENCOUNTER — Encounter (HOSPITAL_COMMUNITY): Payer: Self-pay

## 2015-02-10 DIAGNOSIS — H109 Unspecified conjunctivitis: Secondary | ICD-10-CM | POA: Insufficient documentation

## 2015-02-10 DIAGNOSIS — J309 Allergic rhinitis, unspecified: Secondary | ICD-10-CM | POA: Insufficient documentation

## 2015-02-10 DIAGNOSIS — J302 Other seasonal allergic rhinitis: Secondary | ICD-10-CM

## 2015-02-10 DIAGNOSIS — Z720 Tobacco use: Secondary | ICD-10-CM | POA: Insufficient documentation

## 2015-02-10 DIAGNOSIS — Z8719 Personal history of other diseases of the digestive system: Secondary | ICD-10-CM | POA: Insufficient documentation

## 2015-02-10 MED ORDER — FLUORESCEIN SODIUM 1 MG OP STRP
1.0000 | ORAL_STRIP | Freq: Once | OPHTHALMIC | Status: DC
  Filled 2015-02-10: qty 1

## 2015-02-10 MED ORDER — POLYMYXIN B-TRIMETHOPRIM 10000-0.1 UNIT/ML-% OP SOLN
1.0000 [drp] | OPHTHALMIC | Status: DC
  Filled 2015-02-10: qty 10

## 2015-02-10 NOTE — ED Provider Notes (Signed)
CSN: [id number]     Arrival date & time 02/10/15  0746 History   First MD Initiated Contact with Patient 02/10/15 0747     Chief Complaint  Patient presents with  . Eye Problem     (Consider location/radiation/quality/duration/timing/severity/associated sxs/prior Treatment) HPI Comments: Patient presents to the ER for evaluation of irritation itching and redness of her right eye. Symptoms began this morning. She was sent home from work because of possible infection. She reports that there was not any crusting or matting present. She has not had any vision change or eye pain she does wear contacts.  Patient reports that for the last 2 or 3 days she has also had congestion and sinus fullness.  Patient is a 24 y.o. female presenting with eye problem.  Eye Problem Associated symptoms: itching and redness     Past Medical History  Diagnosis Date  . Acid reflux   . IBS (irritable bowel syndrome)   . Vaginal Pap smear, abnormal    Past Surgical History  Procedure Laterality Date  . No past surgeries     History reviewed. No pertinent family history. History  Substance Use Topics  . Smoking status: Current Every Day Smoker -- 0.25 packs/day for 4 years    Types: Cigars  . Smokeless tobacco: Not on file  . Alcohol Use: Yes     Comment: social   OB History    No data available     Review of Systems  Eyes: Positive for redness and itching.  All other systems reviewed and are negative.     Allergies  Doxycycline and Latex  Home Medications   Prior to Admission medications   Medication Sig Start Date End Date Taking? Authorizing Provider  ibuprofen (ADVIL,MOTRIN) 200 MG tablet Take 400 mg by mouth every 8 (eight) hours as needed for moderate pain.     Historical Provider, MD  ibuprofen (ADVIL,MOTRIN) 600 MG tablet Take 1 tablet (600 mg total) by mouth every 6 (six) hours as needed. 07/14/14   Brandt Loosen, MD  metroNIDAZOLE (FLAGYL) 500 MG tablet Take all four tabs at once  12/08/14   Deirdre C Poe, CNM   BP 138/93 mmHg  Pulse 99  Temp(Src) 98.2 F (36.8 C) (Oral)  Resp 16  SpO2 99% Physical Exam  Constitutional: She is oriented to person, place, and time. She appears well-developed and well-nourished. No distress.  HENT:  Head: Normocephalic and atraumatic.  Right Ear: Hearing normal.  Left Ear: Hearing normal.  Nose: Mucosal edema present.  Mouth/Throat: Oropharynx is clear and moist and mucous membranes are normal.  Eyes: EOM and lids are normal. Pupils are equal, round, and reactive to light. Right eye exhibits no chemosis, no discharge and no exudate. No foreign body present in the right eye. Left eye exhibits no chemosis, no discharge and no exudate. No foreign body present in the left eye. Right conjunctiva is injected (Slightly).  Fluorescein exam negative for uptake - no sign of abrasion or ulceration, no dendritic lesions  Neck: Normal range of motion. Neck supple.  Cardiovascular: Regular rhythm, S1 normal and S2 normal.  Exam reveals no gallop and no friction rub.   No murmur heard. Pulmonary/Chest: Effort normal and breath sounds normal. No respiratory distress. She exhibits no tenderness.  Abdominal: Soft. Normal appearance and bowel sounds are normal. There is no hepatosplenomegaly. There is no tenderness. There is no rebound, no guarding, no tenderness at McBurney's point and negative Murphy's sign. No hernia.  Musculoskeletal: Normal range  of motion.  Neurological: She is alert and oriented to person, place, and time. She has normal strength. No cranial nerve deficit or sensory deficit. Coordination normal. GCS eye subscore is 4. GCS verbal subscore is 5. GCS motor subscore is 6.  Skin: Skin is warm, dry and intact. No rash noted. No cyanosis.  Psychiatric: She has a normal mood and affect. Her speech is normal and behavior is normal. Thought content normal.  Nursing note and vitals reviewed.   ED Course  Procedures (including critical  care time) Labs Review Labs Reviewed - No data to display  Imaging Review No results found.   EKG Interpretation None      MDM   Final diagnoses:  None   conjunctivitis  Seasonal allergies  Patient with complaints of itching and redness of her right eye upon awakening. She does have a history of seasonal allergies. She has not been taking antihistamines. She does have right right-sided conjunctival injection without any other abnormality on examination. Cannot rule out conjunctivitis. Will treat empirically. Patient counseled to take over-the-counter antihistamine medication for her seasonal allergies.    Gilda Creasehristopher J Pollina, MD 02/10/15 [phone]

## 2015-02-10 NOTE — Discharge Instructions (Signed)
Allergies °Allergies may happen from anything your body is sensitive to. This may be food, medicines, pollens, chemicals, and nearly anything around you in everyday life that produces allergens. An allergen is anything that causes an allergy producing substance. Heredity is often a factor in causing these problems. This means you may have some of the same allergies as your parents. °Food allergies happen in all age groups. Food allergies are some of the most severe and life threatening. Some common food allergies are cow's milk, seafood, eggs, nuts, wheat, and soybeans. °SYMPTOMS  °· Swelling around the mouth. °· An itchy red rash or hives. °· Vomiting or diarrhea. °· Difficulty breathing. °SEVERE ALLERGIC REACTIONS ARE LIFE-THREATENING. °This reaction is called anaphylaxis. It can cause the mouth and throat to swell and cause difficulty with breathing and swallowing. In severe reactions only a trace amount of food (for example, peanut oil in a salad) may cause death within seconds. °Seasonal allergies occur in all age groups. These are seasonal because they usually occur during the same season every year. They may be a reaction to molds, grass pollens, or tree pollens. Other causes of problems are house dust mite allergens, pet dander, and mold spores. The symptoms often consist of nasal congestion, a runny itchy nose associated with sneezing, and tearing itchy eyes. There is often an associated itching of the mouth and ears. The problems happen when you come in contact with pollens and other allergens. Allergens are the particles in the air that the body reacts to with an allergic reaction. This causes you to release allergic antibodies. Through a chain of events, these eventually cause you to release histamine into the blood stream. Although it is meant to be protective to the body, it is this release that causes your discomfort. This is why you were given anti-histamines to feel better.  If you are unable to  pinpoint the offending allergen, it may be determined by skin or blood testing. Allergies cannot be cured but can be controlled with medicine. °Hay fever is a collection of all or some of the seasonal allergy problems. It may often be treated with simple over-the-counter medicine such as diphenhydramine. Take medicine as directed. Do not drink alcohol or drive while taking this medicine. Check with your caregiver or package insert for child dosages. °If these medicines are not effective, there are many new medicines your caregiver can prescribe. Stronger medicine such as nasal spray, eye drops, and corticosteroids may be used if the first things you try do not work well. Other treatments such as immunotherapy or desensitizing injections can be used if all else fails. Follow up with your caregiver if problems continue. These seasonal allergies are usually not life threatening. They are generally more of a nuisance that can often be handled using medicine. °HOME CARE INSTRUCTIONS  °· If unsure what causes a reaction, keep a diary of foods eaten and symptoms that follow. Avoid foods that cause reactions. °· If hives or rash are present: °¨ Take medicine as directed. °¨ You may use an over-the-counter antihistamine (diphenhydramine) for hives and itching as needed. °¨ Apply cold compresses (cloths) to the skin or take baths in cool water. Avoid hot baths or showers. Heat will make a rash and itching worse. °· If you are severely allergic: °¨ Following a treatment for a severe reaction, hospitalization is often required for closer follow-up. °¨ Wear a medic-alert bracelet or necklace stating the allergy. °¨ You and your family must learn how to give adrenaline or use   an anaphylaxis kit. °¨ If you have had a severe reaction, always carry your anaphylaxis kit or EpiPen® with you. Use this medicine as directed by your caregiver if a severe reaction is occurring. Failure to do so could have a fatal outcome. °SEEK MEDICAL  CARE IF: °· You suspect a food allergy. Symptoms generally happen within 30 minutes of eating a food. °· Your symptoms have not gone away within 2 days or are getting worse. °· You develop new symptoms. °· You want to retest yourself or your child with a food or drink you think causes an allergic reaction. Never do this if an anaphylactic reaction to that food or drink has happened before. Only do this under the care of a caregiver. °SEEK IMMEDIATE MEDICAL CARE IF:  °· You have difficulty breathing, are wheezing, or have a tight feeling in your chest or throat. °· You have a swollen mouth, or you have hives, swelling, or itching all over your body. °· You have had a severe reaction that has responded to your anaphylaxis kit or an EpiPen®. These reactions may return when the medicine has worn off. These reactions should be considered life threatening. °MAKE SURE YOU:  °· Understand these instructions. °· Will watch your condition. °· Will get help right away if you are not doing well or get worse. °Document Released: 02/07/2003 Document Revised: 03/11/2013 Document Reviewed: 07/14/2008 °[employer]® Patient Information ©2015 [employer]. This information is not intended to replace advice given to you by your health care provider. Make sure you discuss any questions you have with your health care provider. °Conjunctivitis °Conjunctivitis is commonly called "pink eye." Conjunctivitis can be caused by bacterial or viral infection, allergies, or injuries. There is usually redness of the lining of the eye, itching, discomfort, and sometimes discharge. There may be deposits of matter along the eyelids. A viral infection usually causes a watery discharge, while a bacterial infection causes a yellowish, thick discharge. Pink eye is very contagious and spreads by direct contact. °You may be given antibiotic eyedrops as part of your treatment. Before using your eye medicine, remove all drainage from the eye by washing gently  with warm water and cotton balls. Continue to use the medication until you have awakened 2 mornings in a row without discharge from the eye. Do not rub your eye. This increases the irritation and helps spread infection. Use separate towels from other household members. Wash your hands with soap and water before and after touching your eyes. Use cold compresses to reduce pain and sunglasses to relieve irritation from light. Do not wear contact lenses or wear eye makeup until the infection is gone. °SEEK MEDICAL CARE IF:  °· Your symptoms are not better after 3 days of treatment. °· You have increased pain or trouble seeing. °· The outer eyelids become very red or swollen. °Document Released: 12/22/2004 Document Revised: 02/06/2012 Document Reviewed: 11/14/2005 °[employer]® Patient Information ©2015 [employer]. This information is not intended to replace advice given to you by your health care provider. Make sure you discuss any questions you have with your health care provider. ° °

## 2015-02-10 NOTE — ED Notes (Signed)
Pt c/o R eye irritation, itching, and redness this morning.  Denies pain.  Pt reports that she wears contacts.

## 2015-04-10 ENCOUNTER — Emergency Department (HOSPITAL_COMMUNITY)
Admission: EM | Admit: 2015-04-10 | Discharge: 2015-04-10 | Disposition: A | Discharge status: Home / Self Care | Attending: Emergency Medicine | Admitting: Emergency Medicine

## 2015-04-10 ENCOUNTER — Encounter (HOSPITAL_COMMUNITY): Payer: Self-pay | Admitting: Emergency Medicine

## 2015-04-10 DIAGNOSIS — Z91040 Latex allergy status: Secondary | ICD-10-CM | POA: Insufficient documentation

## 2015-04-10 DIAGNOSIS — R0981 Nasal congestion: Secondary | ICD-10-CM

## 2015-04-10 DIAGNOSIS — J302 Other seasonal allergic rhinitis: Secondary | ICD-10-CM | POA: Insufficient documentation

## 2015-04-10 DIAGNOSIS — K219 Gastro-esophageal reflux disease without esophagitis: Secondary | ICD-10-CM | POA: Insufficient documentation

## 2015-04-10 DIAGNOSIS — Z720 Tobacco use: Secondary | ICD-10-CM | POA: Insufficient documentation

## 2015-04-10 DIAGNOSIS — Z7952 Long term (current) use of systemic steroids: Secondary | ICD-10-CM | POA: Insufficient documentation

## 2015-04-10 HISTORY — DX: Other allergy status, other than to drugs and biological substances: Z91.09

## 2015-04-10 MED ORDER — FLUTICASONE PROPIONATE 50 MCG/ACT NA SUSP
2.0000 | Freq: Every day | NASAL | Status: AC
Start: 2015-04-10 — End: ?

## 2015-04-10 NOTE — ED Notes (Addendum)
Pt c/o sinus pressure, nasal drainage to posterior throat, headache, and reddened sclera when she wakes up. Patient seen recently for the same, advised to try Claritin, pt states she used Claritin with no relief.

## 2015-04-10 NOTE — ED Provider Notes (Signed)
CSN: [id number]     Arrival date & time 04/10/15  [id number] History   First MD Initiated Contact with Patient 04/10/15 [phone]     Chief Complaint  Patient presents with  . Nasal Congestion      HPI  Pt was seen at 0745.  Per pt, c/o gradual onset and persistence of constant acute flair of her chronic runny/stuffy nose, ears and sinus congestion for the past 2 weeks.  Denies fevers, no rash, no CP/SOB, no N/V/D, no abd pain. The symptoms have been associated with no other complaints. The patient has a significant history of similar symptoms previously, recently being evaluated for this complaint and multiple prior evals for same.      Past Medical History  Diagnosis Date  . Acid reflux   . IBS (irritable bowel syndrome)   . Vaginal Pap smear, abnormal   . Environmental allergies    Past Surgical History  Procedure Laterality Date  . No past surgeries      History  Substance Use Topics  . Smoking status: Current Every Day Smoker -- 0.25 packs/day for 4 years    Types: Cigars  . Smokeless tobacco: Not on file  . Alcohol Use: Yes     Comment: social    Review of Systems ROS: Statement: All systems negative except as marked or noted in the HPI; Constitutional: Negative for fever and chills. ; ; Eyes: Negative for eye pain, redness and discharge. ; ; ENMT: Negative for ear pain, hoarseness, sore throat. +nasal congestion, sinus pressure and rhinorrhea. ; ; Cardiovascular: Negative for chest pain, palpitations, diaphoresis, dyspnea and peripheral edema. ; ; Respiratory: Negative for cough, wheezing and stridor. ; ; Gastrointestinal: Negative for nausea, vomiting, diarrhea, abdominal pain, blood in stool, hematemesis, jaundice and rectal bleeding. . ; ; Genitourinary: Negative for dysuria, flank pain and hematuria. ; ; Musculoskeletal: Negative for back pain and neck pain. Negative for swelling and trauma.; ; Skin: Negative for pruritus, rash, abrasions, blisters, bruising and skin lesion.; ;  Neuro: Negative for headache, lightheadedness and neck stiffness. Negative for weakness, altered level of consciousness , altered mental status, extremity weakness, paresthesias, involuntary movement, seizure and syncope.     Allergies  Doxycycline and Latex  Home Medications   Prior to Admission medications   Medication Sig Start Date End Date Taking? Authorizing Provider  calcium carbonate (TUMS - DOSED IN MG ELEMENTAL CALCIUM) 500 MG chewable tablet Chew 1 tablet by mouth daily.   Yes Historical Provider, MD  estradiol cypionate (DEPO-ESTRADIOL) 5 MG/ML injection Inject 1.5 mg into the muscle every 28 (twenty-eight) days.   Yes Historical Provider, MD  loratadine (CLARITIN) 10 MG tablet Take 10 mg by mouth daily.   Yes Historical Provider, MD  Multiple Vitamins-Minerals (MULTIVITAMIN WITH MINERALS) tablet Take 1 tablet by mouth daily.   Yes Historical Provider, MD  fluticasone (FLONASE) 50 MCG/ACT nasal spray Place 2 sprays into both nostrils daily. 04/10/15   Samuel JesterKathleen Samara Stankowski, DO  ibuprofen (ADVIL,MOTRIN) 600 MG tablet Take 1 tablet (600 mg total) by mouth every 6 (six) hours as needed. Patient not taking: Reported on 04/10/2015 07/14/14   Brandt LoosenJulie Manly, MD  metroNIDAZOLE (FLAGYL) 500 MG tablet Take all four tabs at once Patient not taking: Reported on 04/10/2015 12/08/14   Deirdre C Poe, CNM   BP 135/76 mmHg  Pulse 82  Temp(Src) 98.5 F (36.9 C) (Oral)  Resp 20  SpO2 100% Physical Exam  0750: Physical examination:  Nursing notes reviewed; Vital signs and O2  SAT reviewed;  Constitutional: Well developed, Well nourished, Well hydrated, In no acute distress; Head:  Normocephalic, atraumatic; Eyes: EOMI, PERRL, No scleral icterus; ENMT: TM's clear bilat. +edemetous nasal turbinates bilat with clear rhinorrhea. Mouth and pharynx without lesions. No tonsillar exudates. No intra-oral edema. No submandibular or sublingual edema. No hoarse voice, no drooling, no stridor. No pain with manipulation of  larynx. No trismus.  Mouth and pharynx normal, Mucous membranes moist; Neck: Supple, Full range of motion, No lymphadenopathy; Cardiovascular: Regular rate and rhythm, No murmur, rub, or gallop; Respiratory: Breath sounds clear & equal bilaterally, No rales, rhonchi, wheezes.  Speaking full sentences with ease, Normal respiratory effort/excursion; Chest: Nontender, Movement normal; Abdomen: Soft, Nontender, Nondistended, Normal bowel sounds; Genitourinary: No CVA tenderness; Extremities: Pulses normal, No tenderness, No edema, No calf edema or asymmetry.; Neuro: AA&Ox3, Major CN grossly intact.  Speech clear. No gross focal motor or sensory deficits in extremities.; Skin: Color normal, Warm, Dry.   ED Course  Procedures     EKG Interpretation None      MDM  MDM Reviewed: previous chart, nursing note and vitals      0800:  Pt requesting a work note. Tx symptomatically at this time. Dx d/w pt and family.  Questions answered.  Verb understanding, agreeable to d/c home with outpt f/u.   Samuel JesterKathleen Raynor Calcaterra, DO 04/12/15 [phone]

## 2015-04-10 NOTE — Discharge Instructions (Signed)
°Emergency Department Resource Guide °1) Find a Doctor and Pay Out of Pocket °Although you won't have to find out who is covered by your insurance plan, it is a good idea to ask around and get recommendations. You will then need to call the office and see if the doctor you have chosen will accept you as a new patient and what types of options they offer for patients who are self-pay. Some doctors offer discounts or will set up payment plans for their patients who do not have insurance, but you will need to ask so you aren't surprised when you get to your appointment. ° °2) Contact Your Local Health Department °Not all health departments have doctors that can see patients for sick visits, but many do, so it is worth a call to see if yours does. If you don't know where your local health department is, you can check in your phone book. The CDC also has a tool to help you locate your state's health department, and many state websites also have listings of all of their local health departments. ° °3) Find a Walk-in Clinic °If your illness is not likely to be very severe or complicated, you may want to try a walk in clinic. These are popping up all over the country in pharmacies, drugstores, and shopping centers. They're usually staffed by nurse practitioners or physician assistants that have been trained to treat common illnesses and complaints. They're usually fairly quick and inexpensive. However, if you have serious medical issues or chronic medical problems, these are probably not your best option. ° °No Primary Care Doctor: °- Call [facility] at  832-8000 - they can help you locate a primary care doctor that  accepts your insurance, provides certain services, etc. °- Physician Referral Service- 1-800-533-3463 ° °Chronic Pain Problems: °Organization         Address  Phone   Notes  °[facility]  (336) 297-2271 Patients need to be referred by their primary care doctor.  ° °Medication  Assistance: °Organization         Address  Phone   Notes  °[place] [facility] [address] °[place], [place] [address] (336) 641-8030 --Must be a resident of [place] °-- Must have NO insurance coverage whatsoever (no Medicaid/ Medicare, etc.) °-- The pt. MUST have a primary care doctor that directs their care regularly and follows them in the community °  °[facility]  (866) 331-1348   °[employer]  (888) 892-1162   ° °Agencies that provide inexpensive medical care: °Organization         Address  Phone   Notes  °[facility]  (336) 832-8035   °[facility]    (336) 832-7272   °[facility] [address] °[place], [place] [address] (336) 832-4777   °[facility] [address], °[place] (336) 271-4999   °[facility]    (336) 373-0678   °[facility]    (336) 272-1050   °[facility] ° [address], [place] Phone:  (336) 832-4444, Fax:  (336) 832-4440 Hours of Operation:  9 am - 6 pm, M-F.  Also accepts Medicaid/Medicare and self-pay.  °[facility] ° [address], [place] Phone: (336) 832-3150, Fax: (336) 832-3151. Hours of Operation:  8:30 am - 5:30 pm, M-F.  Also accepts Medicaid and self-pay.  °[facility] [place] [address], [place] Phone: (336) 878-6027   °[facility] [address], [place], [place] (336)723-1848, Ext. 123 Mondays & Thursdays: 7-9 AM.  First 15 patients are seen on a first come, first serve basis. °  ° °Medicaid-accepting [place] Providers: ° °Organization         Address  Phone   Notes  °[facility] [address], [place] (336) 641-2100 Also accepts self-pay patients.  °[facility] [address], [place] ° (336) 856-9996   °[facility] [address], [place]  (336) 288-8857   °[facility] [address] [place] [address], [place] (336) 299-7000   °Veita Bland [address], [place]  ° (336) 373-1557 Only accepts [place] Access Medicaid patients after they have their name applied to their card.  ° °Self-Pay (no insurance) in [place]: ° °Organization         Address  Phone   Notes  °Sickle Cell Patients, [facility] [address], [place] (336) 832-1970   °[facility] [address], [place] (336) 832-4400   °[facility] ° [address], [place] (336) 992-4800   °[facility]/Dr. Osei-Bonsu ° [address] [place] [address], [place] or [address], [place] (336) 841-8500 Phone number for both [place] and [place] locations is the same.  °[facility] [address], [place] (336) 299-0000   °[facility] [address] [place] [address], [place] or [address] (336) 852-7530 °(336) 878-2260   °[facility] [address], [place] (336) 350-1642, phone; (336) 294-5005, fax Sees patients 1st and 3rd Saturday of every month.  Must not qualify for public or private insurance (i.e. Medicaid, Medicare, [place] Health Choice, Veterans' Benefits) • Household income should be no more than 200% of the poverty level •The clinic cannot treat you if you are pregnant or think you are pregnant • Sexually transmitted diseases are not treated at the clinic.  ° ° °Dental Care: °Organization         Address  Phone  Notes  °[place] [facility] [address], [place] (336) 641-6152 Accepts children up to age 21 who are enrolled in Medicaid or [place] Health Choice; pregnant women with a Medicaid card; and children who have applied for Medicaid or [place] Health Choice, but were declined, whose parents can pay a reduced fee at time of service.  °[place]  [facility] [place]  [address], [place] (336) 641-7733 Accepts children up to age 21 who are enrolled in Medicaid or [place] Health Choice; pregnant women with a Medicaid card; and children who have applied for Medicaid or [place] Health Choice, but were declined, whose parents can pay a reduced fee at time of service.  °[facility] ° [address], [place] (336) 641-4533 Patients are seen by appointment only. Walk-ins are not accepted. [facility] will see patients 18 years of age and older. °Monday - Tuesday (8am-5pm) °Most Wednesdays (8:30-5pm) °$30 per visit, cash only  °[facility] ° [address], [place] (336) 641-4533 Patients are seen by appointment only. Walk-ins are not accepted. [facility] will see patients 18 years of age and older. °One   Wednesday Evening (Monthly: Volunteer Based).  $30 per visit, cash only  °[facility]  (919) 537-3737 for adults; Children under age 4, call Graduate Pediatric Dentistry at ([phone]) [phone]. Children aged 4-14, please call ([phone]) [phone] to request a pediatric application. ° Dental services are provided in all areas of dental care including fillings, crowns and bridges, complete and partial dentures, implants, gum treatment, root canals, and extractions. Preventive care is also provided. Treatment is provided to both adults and children. °Patients are selected via a lottery and there is often a waiting list. °  °[facility] [address], °[place] ° (336) 763-8833 [email or web address] °  °[facility] [address], [place], [place] (336)723-1848, Ext. 123 Second and Fourth Thursday of each month, opens at 6:30 AM; Clinic ends at 9 AM.  Patients are seen on a first-come first-served basis, and a limited number are seen during each clinic.  ° °[facility] ° [address], [place], [place] (336) 723-7904    Eligibility Requirements °You must have lived in [place], [place], or [place] counties for at least the last three months. °  You cannot be eligible for state or federal sponsored healthcare insurance, including Veterans Administration, Medicaid, or Medicare. °  You generally cannot be eligible for healthcare insurance through your employer.  °  How to apply: °Eligibility screenings are held every Tuesday and Wednesday afternoon from 1:00 pm until 4:00 pm. You do not need an appointment for the interview!  °[facility] [address], [place], [place] 336-631-2330   °[facility]  336-342-8273   °[facility]  336-703-3100   °[facility]  336-570-6415   ° °Behavioral Health Resources in the Community: °Intensive Outpatient Programs °Organization         Address  Phone  Notes  °[facility] [address], [place], [place] 336-878-6098   °[facility] [address], [place], [place] 336-832-9800   °[facility] [address], [place], [place] ° 336-882-2125   °[facility] [address],  °[place], [place] 1-800-853-5163 or 336-641-4981   °Substance Abuse Resources °Organization         Address  Phone  Notes  °[facility]  336-882-2125   °[facility]  336-784-9470   °[facility]  336-285-9073   °[facility]  336-845-3988   °Residential & Outpatient Substance Abuse Program  1-800-659-3381   °Psychological Services °Organization         Address  Phone  Notes  °[facility]  336- 832-9600   °[facility]  336- 378-7881   °[facility] [address], [place] 1-800-853-5163 or 336-641-4981   ° °Mobile Crisis Teams °Organization         Address  Phone  Notes  °[facility], Mobile Crisis Care Unit  1-877-626-1772   °Assertive °[facility] ° [address]  [place], [place] 336-834-9664   °Sharon DeEsch [address] °[place] 336-554-5454   ° °Self-Help/Support Groups °Organization         Address  Phone             Notes  °[facility]  - variety of support groups  336- 373-1402 Call for more information  °Narcotics Anonymous (NA), [facility] [address], °[place]  2 meetings at this location  ° °  Residential Treatment Programs Organization         Address  Phone  Notes  [facility] [address],    [place]  [phone]   [facility]  [address], [place] [phone], [place], [place] [phone]   [facility] [address], [place] [phone] Admissions: 8am-3pm M-F  [facility] [address]    [place], [place] [phone]   [facility] [address], [place], [place] [phone]   [facility] [address]  [place], [place] [phone]   [facility] Intensive Outpatient [address], [place], [place] [phone]   [facility] [address]  [place], [place] [phone] or [phone]   [facility] [address] [place], [place] [phone] Accepts Medicaid  [facility] [address]  [place] [phone] Substance Abuse/Addiction Treatment   [facility]         Address  Phone  Notes  [facility]  239-852-2503(888) 316-862-3741   Angie FavaJulie Brannon, PhD [address], [address] [place], [place]   [phone](336) ([phone]) [phone] or (717)[phone](336) [phone]   [facility]   [address] [place], [place] [phone](336) [phone]   [facility] [address], [place], [place] [phone](336) [phone] Insurance/Medicaid/sponsorship through [facility] [address]                                    [place], [place] [phone](336) [phone] Therapy/tele-psych/case    [facility] [address]   [place], [place] [phone](336) [phone]    Dr. Lolly MustacheArfeen  [phone](336) [phone]   [facility]  [employer] [facility] 1) [address],  2) [address], [place] 3)  [address], [place] ([phone]) [phone](336) [phone](336) [phone]  [phone](336) ([phone]   [facility] Child Abuse Hotline 640-813-3197(336) 386-379-7123 or (956)[phone](336) (713)[phone] (After Hours)      Take over the counter decongestant (such as sudafed), as well as antihistamine (such as claritin, allegra, or zyrtec), as directed on packaging, for the next week.  Use over the counter normal saline nasal spray, as instructed in the Emergency Department, several times per day for the next 2 weeks. Take the prescription as directed. Call your regular medical doctor and the ENT doctor today to schedule a follow up appointment within the next week.  Return to the Emergency Department immediately if worsening.

## 2015-05-08 ENCOUNTER — Emergency Department (HOSPITAL_COMMUNITY)
Admission: EM | Admit: 2015-05-08 | Discharge: 2015-05-08 | Disposition: A | Discharge status: Home / Self Care | Attending: Emergency Medicine | Admitting: Emergency Medicine

## 2015-05-08 ENCOUNTER — Encounter (HOSPITAL_COMMUNITY): Payer: Self-pay | Admitting: Emergency Medicine

## 2015-05-08 DIAGNOSIS — Z7951 Long term (current) use of inhaled steroids: Secondary | ICD-10-CM | POA: Insufficient documentation

## 2015-05-08 DIAGNOSIS — J029 Acute pharyngitis, unspecified: Secondary | ICD-10-CM | POA: Insufficient documentation

## 2015-05-08 DIAGNOSIS — Z79899 Other long term (current) drug therapy: Secondary | ICD-10-CM | POA: Insufficient documentation

## 2015-05-08 DIAGNOSIS — J3489 Other specified disorders of nose and nasal sinuses: Secondary | ICD-10-CM | POA: Insufficient documentation

## 2015-05-08 DIAGNOSIS — Z91040 Latex allergy status: Secondary | ICD-10-CM | POA: Insufficient documentation

## 2015-05-08 DIAGNOSIS — Z87891 Personal history of nicotine dependence: Secondary | ICD-10-CM | POA: Insufficient documentation

## 2015-05-08 DIAGNOSIS — K219 Gastro-esophageal reflux disease without esophagitis: Secondary | ICD-10-CM | POA: Insufficient documentation

## 2015-05-08 DIAGNOSIS — H6093 Unspecified otitis externa, bilateral: Secondary | ICD-10-CM

## 2015-05-08 MED ORDER — CIPROFLOXACIN-HYDROCORTISONE 0.2-1 % OT SUSP: 3.0000 [drp] | Freq: Two times a day (BID) | OTIC | Status: DC

## 2015-05-08 MED ORDER — PSEUDOEPHEDRINE HCL 30 MG PO TABS: 30.0000 mg | ORAL_TABLET | ORAL | Status: DC | PRN

## 2015-05-08 NOTE — Discharge Instructions (Signed)
Otitis Externa Take anti-biotics as prescribed. Use Sudafed as a decongestion and take as needed. Otitis externa is a bacterial or fungal infection of the outer ear canal. This is the area from the eardrum to the outside of the ear. Otitis externa is sometimes called "swimmer's ear." CAUSES  Possible causes of infection include:  Swimming in dirty water.  Moisture remaining in the ear after swimming or bathing.  Mild injury (trauma) to the ear.  Objects stuck in the ear (foreign body).  Cuts or scrapes (abrasions) on the outside of the ear. SIGNS AND SYMPTOMS  The first symptom of infection is often itching in the ear canal. Later signs and symptoms may include swelling and redness of the ear canal, ear pain, and yellowish-white fluid (pus) coming from the ear. The ear pain may be worse when pulling on the earlobe. DIAGNOSIS  Your health care provider will perform a physical exam. A sample of fluid may be taken from the ear and examined for bacteria or fungi. TREATMENT  Antibiotic ear drops are often given for 10 to 14 days. Treatment may also include pain medicine or corticosteroids to reduce itching and swelling. HOME CARE INSTRUCTIONS   Apply antibiotic ear drops to the ear canal as prescribed by your health care provider.  Take medicines only as directed by your health care provider.  If you have diabetes, follow any additional treatment instructions from your health care provider.  Keep all follow-up visits as directed by your health care provider. PREVENTION   Keep your ear dry. Use the corner of a towel to absorb water out of the ear canal after swimming or bathing.  Avoid scratching or putting objects inside your ear. This can damage the ear canal or remove the protective wax that lines the canal. This makes it easier for bacteria and fungi to grow.  Avoid swimming in lakes, polluted water, or poorly chlorinated pools.  You may use ear drops made of rubbing alcohol and  vinegar after swimming. Combine equal parts of white vinegar and alcohol in a bottle. Put 3 or 4 drops into each ear after swimming. SEEK MEDICAL CARE IF:   You have a fever.  Your ear is still red, swollen, painful, or draining pus after 3 days.  Your redness, swelling, or pain gets worse.  You have a severe headache.  You have redness, swelling, pain, or tenderness in the area behind your ear. MAKE SURE YOU:   Understand these instructions.  Will watch your condition.  Will get help right away if you are not doing well or get worse. Document Released: 11/14/2005 Document Revised: 03/31/2014 Document Reviewed: 12/01/2011 [facility] Patient Information 2015 [place], [place]. This information is not intended to replace advice given to you by your health care provider. Make sure you discuss any questions you have with your health care provider.  Emergency Department Resource Guide 1) Find a Doctor and Pay Out of Pocket Although you won't have to find out who is covered by your insurance plan, it is a good idea to ask around and get recommendations. You will then need to call the office and see if the doctor you have chosen will accept you as a new patient and what types of options they offer for patients who are self-pay. Some doctors offer discounts or will set up payment plans for their patients who do not have insurance, but you will need to ask so you aren't surprised when you get to your appointment.  2) Contact Your Local  Health Department Not all health departments have doctors that can see patients for sick visits, but many do, so it is worth a call to see if yours does. If you don't know where your local health department is, you can check in your phone book. The CDC also has a tool to help you locate your state's health department, and many state websites also have listings of all of their local health departments.  3) Find a [facility] If your illness is not likely to be very  severe or complicated, you may want to try a walk in clinic. These are popping up all over the country in pharmacies, drugstores, and shopping centers. They're usually staffed by nurse practitioners or physician assistants that have been trained to treat common illnesses and complaints. They're usually fairly quick and inexpensive. However, if you have serious medical issues or chronic medical problems, these are probably not your best option.  No Primary Care Doctor: - Call Health Connect at  [phone] - they can help you locate a primary care doctor that  accepts your insurance, provides certain services, etc. - Physician Referral Service- [phone]  Chronic Pain Problems: Organization         Address  Phone   Notes  [facility]  [phone] Patients need to be referred by their primary care doctor.   Medication Assistance: Organization         Address  Phone   Notes  [facility] Medication [facility] [address] [place], [place] [address] [phone] --Must be a resident of [facility] -- Must have NO insurance coverage whatsoever (no Medicaid/ Medicare, etc.) -- The pt. MUST have a primary care doctor that directs their care regularly and follows them in the community   MedAssist  [phone]   [employer]  [phone]    Agencies that provide inexpensive medical care: Organization         Address  Phone   Notes  [place]  [phone]   [facility]    [phone]   [facility] [place], [place] [address] [phone]   [place] [address], [place] [phone]   Planned Parenthood    [phone]   [facility]    [phone]   [place] and [place] [address], [place] Phone:  [phone], Fax:  [phone] Hours of Operation:  9 am - 6 pm, M-F.  Also accepts  Medicaid/Medicare and self-pay.  [facility] for [place], [address], [place] Phone: [phone], Fax: [phone]. Hours of Operation:  8:30 am - 5:30 pm, M-F.  Also accepts Medicaid and self-pay.  [facility] [place] [address] [place] Phone: [phone]   [place], [place], [place] [phone], Ext. [phone] Mondays & Thursdays: 7-9 AM.  First 15 patients are seen on a first come, first serve basis.    [place] Providers:  Organization         Address  Phone   Notes  [facility] [address], [place] [phone] Also accepts self-pay patients.  [place], [place]  [phone]   [place], [place], [place] [phone]  [place] [address], [place] [phone]   Lucianne Lei [address], [place]   [phone] Only accepts [place] Access [place] patients after they have their name applied to their card.   Self-Pay (no insurance) in [facility]:  Organization         Address  Phone   Notes  Sickle Cell Patients, [facility] Internal Medicine [place] [phone]   [facility] [place] [phone]   [facility] [place]  [place], [place], [place] [phone]   [facility]/Dr. Osei-Bonsu  [address] [place] or [address], [place] [phone] Phone number for both [place] and [place] locations is the same.  Urgent Medical and [facility] [address], [place] [phone]   [facility] [address] [place] or [address] [phone]   [facility] [address] [place] [phone], phone; [phone], fax Sees patients 1st and 3rd Saturday of every month.  Must not qualify for public or private insurance (i.e. Medicaid, Medicare, [place] Health Choice, Veterans' Benefits)  Household income should be no more than 200% of the poverty level The clinic cannot treat you if you are pregnant or think you are pregnant  Sexually transmitted diseases are not treated at the clinic.    Dental Care: Organization         Address  Phone  Notes  [facility] Department of [facility] [place] [phone] Accepts children up to age 60 who are enrolled in [place] or [place]; pregnant women with a Medicaid card; and children who have applied for Medicaid or [place] Health Choice, but were declined, whose parents can pay a reduced fee at time of service.  [facility] Department of [employer]  [address], [place] [phone] Accepts children up to age 43 who are enrolled in [place] or [place]; pregnant women with a Medicaid card; and children who have applied for Medicaid or [place] Health Choice, but were declined, whose parents can pay a reduced fee at time of service.  [place] Adult Dental Access PROGRAM  [place] [phone] Patients are seen by appointment only. Walk-ins are not accepted. [place] will see patients 80 years of age and older. Monday - Tuesday (8am-5pm) Most Wednesdays (8:30-5pm) $30 per visit, cash only  [facility] Adult Dental Access PROGRAM  [address], [facility] [phone] Patients are seen by appointment only. Walk-ins are not accepted. [place] will see patients 46 years of age and older. One Wednesday Evening (Monthly: Volunteer Based).  $30 per visit, cash only  [place]  [phone] for adults; Children under age 68, call Graduate Pediatric Dentistry at [phone]. Children aged  33-14, please call [phone] to request a pediatric application.  Dental services are provided in all areas of dental care including fillings, crowns and bridges, complete and partial dentures, implants, gum treatment, root canals, and extractions. Preventive care is also provided. Treatment is provided to both adults and children. Patients are selected via a lottery and there is often a waiting list.   [facility] [address] [place]  [phone] [email or web address]  [facility] [address] [place], [place] [phone], Ext. [phone] Second and Fourth Thursday of each month, opens at 6:30 AM; Clinic ends at 9 AM.  Patients are seen on a first-come first-served basis, and a limited number are seen during each clinic.   [facility]  [address] [place], [place] [phone]   Eligibility Requirements You must have lived in [place], [place], or [place] counties for at least the last three months.   You cannot be eligible for state or federal sponsored [employer], including [place] [employer], [place], or [employer].   You generally cannot be eligible for healthcare insurance through your employer.    How to apply: Eligibility screenings are held every Tuesday and Wednesday afternoon from 1:00 pm until 4:00 pm. You do not need an appointment for the interview!  [facility] [address] [place], [place]   [place]  [place] Department  [place]  [phone]    Behavioral Health Resources in the Community: Intensive Outpatient Programs Organization         Address  Phone  Notes  Theodosia Ekwok. [address], [place], [place] [phone]   [facility] Outpatient [address] [place], Eugenio Saenz   ADS: Alcohol & Drug Svcs [address],  [place], [place]   [place] [address],  [place], [place] or [phone]   Substance Abuse Resources Organization         Address  Phone  Notes  Alcohol and Drug Services  [phone]   [place]  [phone]   [facility] [place]   Chinita Pester  [phone]   Residential & Outpatient Substance Abuse Program  [phone]   Psychological Services Organization         Address  Phone  Notes  [facility] [place]  [place]  [phone]   [place] [address] [place] or [phone]    Mobile Crisis Teams Organization         Address  Phone  Notes  [facility], Mobile Crisis Care Unit  [phone]   [facility]  [address] [place], [place]   [place] [address] [place] [phone]    Self-Help/Support Groups Organization         Address  Phone             Notes  [place]. of [place] - variety of support groups  [place] Call for more information  Narcotics Anonymous (NA), [facility]aring [facility] [address], [employer] [place]  2 meetings at this location   Special educational needs teacher         Address  Phone  Notes  [facility] [place],    [place]  [phone]   [facility]  [address] [place] [phone], [place], [place]   [place], [place] [phone] Admissions: 8am-3pm M-F  Incentives Substance [place] [address]    [place], [place] [phone]   [facility] [address] Jadene Pierini [place], [place]   The [place].,  [place]ith [place], [place] - Intensive Outpatient [address]  [address] [place], [place]   [facility]  ([place].) [place].,  [place], [place] [phone] or [phone]   [facility]idential Treatment Services (RTS) [address], [place], [place] Accepts Medicaid  [facility] [place] [address]  [place] [phone] Substance Abuse/Addiction Treatment   [facility] Organization         Address  Phone  Notes  [facility]  [phone]   Domenic Schwab, PhD [address] [place], [place]   [phone] or [phone]   [place]   [address] [place], [place] [phone]   [place] [address], [place], [place] [phone] Insurance/Medicaid/sponsorship through [facility] and Families [address] [place]                                    [place], [place] [phone] [place] [address] [place] [address] [place], [place] [phone]    Dr. Adele Schilder  [phone]   [facility] Dept. 1) [address] [place] 2) [place] 3)  [place] [address], [place] [phone]  [phone]   [place] [phone] or [phone] (After Hours)

## 2015-05-08 NOTE — ED Notes (Signed)
Pt. reports bilateral ear ache for 2 weeks , denies injury , no drainage or fever.

## 2015-05-08 NOTE — ED Provider Notes (Signed)
CSN: [id number]     Arrival date & time 05/08/15  2042 History  This chart was scribed for Courtney Gosselin, PA-C working with No att. providers found by Elveria Rising, ED Scribe. This patient was seen in room TR06C/TR06C and the patient's care was started at 9:06 PM.   Chief Complaint  Patient presents with  . Otalgia   The history is provided by the patient. No language interpreter was used.   HPI Comments: Courtney Martinez is a 24 y.o. female who presents to the Emergency Department complaining of worsening bilateral ear pain ongoing for two weeks now. Patient describes pain as pressure stating her ears feel "stopped up." Patient reports itchy ears 3 weeks ago which attempted to relieve by scratching with a bobby pin. Patient reports treatment at home with warm "sweet oil" without relief. Patient reports associated scratchy, sore throat, nasal congestion, rhinorrhea and history of seasonal allergies. Patient shares treatment with OTC allergy medications without relief. 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 She does not wear muffs or ear plugs at work because management has deemed them unnecessary.   Past Medical History  Diagnosis Date  . Acid reflux   . IBS (irritable bowel syndrome)   . Vaginal Pap smear, abnormal   . Environmental allergies    Past Surgical History  Procedure Laterality Date  . No past surgeries     No family history on file. History  Substance Use Topics  . Smoking status: Current Every Day Smoker -- 0.25 packs/day for 4 years    Types: Cigars  . Smokeless tobacco: Not on file  . Alcohol Use: Yes     Comment: social   OB History    No data available     Review of Systems  Constitutional: Negative for fever and chills.  HENT: Positive for congestion, ear pain, rhinorrhea and sore throat. Negative for ear discharge.   Respiratory: Negative for cough.     Allergies  Doxycycline and Latex  Home Medications    Prior to Admission medications   Medication Sig Start Date End Date Taking? Authorizing Provider  calcium carbonate (TUMS - DOSED IN MG ELEMENTAL CALCIUM) 500 MG chewable tablet Chew 1 tablet by mouth daily.    Historical Provider, MD  ciprofloxacin-hydrocortisone (CIPRO HC) otic suspension Place 3 drops into both ears 2 (two) times daily. 05/08/15   Tannya Gonet Patel-Mills, PA-C  estradiol cypionate (DEPO-ESTRADIOL) 5 MG/ML injection Inject 1.5 mg into the muscle every 28 (twenty-eight) days.    Historical Provider, MD  fluticasone (FLONASE) 50 MCG/ACT nasal spray Place 2 sprays into both nostrils daily. 04/10/15   Samuel Jester, DO  ibuprofen (ADVIL,MOTRIN) 600 MG tablet Take 1 tablet (600 mg total) by mouth every 6 (six) hours as needed. Patient not taking: Reported on 04/10/2015 07/14/14   Brandt Loosen, MD  loratadine (CLARITIN) 10 MG tablet Take 10 mg by mouth daily.    Historical Provider, MD  metroNIDAZOLE (FLAGYL) 500 MG tablet Take all four tabs at once Patient not taking: Reported on 04/10/2015 12/08/14   Deirdre C Poe, CNM  Multiple Vitamins-Minerals (MULTIVITAMIN WITH MINERALS) tablet Take 1 tablet by mouth daily.    Historical Provider, MD  pseudoephedrine (SUDAFED) 30 MG tablet Take 1 tablet (30 mg total) by mouth every 4 (four) hours as needed for congestion. 05/08/15   Courtney Gosselin, PA-C   Triage Vitals: BP 131/74 mmHg  Pulse 97  Temp(Src) 98.8 F (37.1 C) (Oral)  Resp 18  Ht 5\' 4"  (1.626 m)  Wt 157 lb (71.215 kg)  BMI 26.94 kg/m2  SpO2 100% Physical Exam  Constitutional: She is oriented to person, place, and time. She appears well-developed and well-nourished. No distress.  HENT:  Head: Normocephalic and atraumatic.  Right Ear: Ear canal normal. No lacerations. No foreign bodies. Tympanic membrane is not erythematous, not retracted and not bulging.  Left Ear: Ear canal normal. No lacerations. No foreign bodies. Tympanic membrane is not erythematous, not retracted and not  bulging.  Mouth/Throat: No oropharyngeal exudate.  Pain with movement of the pinna and tragus of the right ear. Tenderness to external ear when using otoscope bilaterally.   Eyes: EOM are normal.  Neck: Neck supple. No tracheal deviation present.  Cardiovascular: Normal rate.   Pulmonary/Chest: Effort normal. No respiratory distress.  Musculoskeletal: Normal range of motion.  Neurological: She is alert and oriented to person, place, and time.  Skin: Skin is warm and dry.  Psychiatric: She has a normal mood and affect. Her behavior is normal.  Nursing note and vitals reviewed.   ED Course  Procedures (including critical care time)  COORDINATION OF CARE: 9:14 PM- Discussed treatment plan with patient at bedside and patient agreed to plan.   Labs Review Labs Reviewed - No data to display  Imaging Review No results found.   EKG Interpretation None      MDM   Final diagnoses:  Otitis externa, bilateral  Patient presents for bilateral ear pain after using a bobby pin to clean her ears. She states she feels that her ears are congested. She says she has tried allergy medications in the past without relief. On exam she is exquisitely tender when using the otoscope. She may have an otitis externa from using a foreign object in the ear. I prescribed ciprofloxacin drops. I also discussed that this could possibly be congestion. I also gave her Sudafed. She can try holding her nose and blowing out to clear her ears. Patient verbally agrees with the plan. I personally performed the services described in this documentation, which was scribed in my presence. The recorded information has been reviewed and is accurate.    Courtney Gosselin, PA-C 05/09/15 0250  [place]ilden Fossa, MD 05/10/15 [id number]

## 2015-06-03 ENCOUNTER — Encounter (HOSPITAL_COMMUNITY): Payer: Self-pay | Admitting: Physical Medicine and Rehabilitation

## 2015-06-03 ENCOUNTER — Emergency Department (HOSPITAL_COMMUNITY)
Admission: EM | Admit: 2015-06-03 | Discharge: 2015-06-03 | Disposition: A | Discharge status: Home / Self Care | Attending: Emergency Medicine | Admitting: Emergency Medicine

## 2015-06-03 DIAGNOSIS — K219 Gastro-esophageal reflux disease without esophagitis: Secondary | ICD-10-CM | POA: Insufficient documentation

## 2015-06-03 DIAGNOSIS — Z91040 Latex allergy status: Secondary | ICD-10-CM | POA: Insufficient documentation

## 2015-06-03 DIAGNOSIS — R109 Unspecified abdominal pain: Secondary | ICD-10-CM | POA: Insufficient documentation

## 2015-06-03 DIAGNOSIS — Z7951 Long term (current) use of inhaled steroids: Secondary | ICD-10-CM | POA: Insufficient documentation

## 2015-06-03 DIAGNOSIS — Z720 Tobacco use: Secondary | ICD-10-CM | POA: Insufficient documentation

## 2015-06-03 DIAGNOSIS — Z79899 Other long term (current) drug therapy: Secondary | ICD-10-CM | POA: Insufficient documentation

## 2015-06-03 DIAGNOSIS — T50905A Adverse effect of unspecified drugs, medicaments and biological substances, initial encounter: Secondary | ICD-10-CM

## 2015-06-03 DIAGNOSIS — R000 Tachycardia, unspecified: Secondary | ICD-10-CM | POA: Insufficient documentation

## 2015-06-03 DIAGNOSIS — J019 Acute sinusitis, unspecified: Secondary | ICD-10-CM | POA: Insufficient documentation

## 2015-06-03 MED ORDER — CEPHALEXIN 500 MG PO CAPS: 500.0000 mg | ORAL_CAPSULE | Freq: Four times a day (QID) | ORAL | Status: DC

## 2015-06-03 NOTE — ED Provider Notes (Signed)
CSN: [id number]     Arrival date & time 06/03/15  1158 History   First MD Initiated Contact with Patient 06/03/15 1231     Chief Complaint  Patient presents with  . Medication Reaction     (Consider location/radiation/quality/duration/timing/severity/associated sxs/prior Treatment) HPI Comments: Patient is a [age] year old female with history of chronic sinus issues. She was seen by her primary doctor yesterday and prescribed amoxicillin and prednisone. She took a dose of prednisone and amoxicillin last night and went to bed. She woke up this morning and took another dose of amoxicillin. Approximately one hour later she developed a racing heart and abdominal cramping. She denies any throat swelling, rash, itching. She denies any shortness of breath or any chest pain.  The history is provided by the patient.    Past Medical History  Diagnosis Date  . Acid reflux   . IBS (irritable bowel syndrome)   . Vaginal Pap smear, abnormal   . Environmental allergies    Past Surgical History  Procedure Laterality Date  . No past surgeries     No family history on file. History  Substance Use Topics  . Smoking status: Current Every Day Smoker -- 0.25 packs/day for 4 years    Types: Cigars  . Smokeless tobacco: Not on file  . Alcohol Use: Yes     Comment: social   OB History    No data available     Review of Systems  All other systems reviewed and are negative.     Allergies  Doxycycline and Latex  Home Medications   Prior to Admission medications   Medication Sig Start Date End Date Taking? Authorizing Provider  calcium carbonate (TUMS - DOSED IN MG ELEMENTAL CALCIUM) 500 MG chewable tablet Chew 1 tablet by mouth daily.    Historical Provider, MD  ciprofloxacin-hydrocortisone (CIPRO HC) otic suspension Place 3 drops into both ears 2 (two) times daily. 05/08/15   Hanna Patel-Mills, PA-C  estradiol cypionate (DEPO-ESTRADIOL) 5 MG/ML injection Inject 1.5 mg into the muscle every 28  (twenty-eight) days.    Historical Provider, MD  fluticasone (FLONASE) 50 MCG/ACT nasal spray Place 2 sprays into both nostrils daily. 04/10/15   Samuel JesterKathleen McManus, DO  ibuprofen (ADVIL,MOTRIN) 600 MG tablet Take 1 tablet (600 mg total) by mouth every 6 (six) hours as needed. Patient not taking: Reported on 04/10/2015 07/14/14   Brandt LoosenJulie Manly, MD  loratadine (CLARITIN) 10 MG tablet Take 10 mg by mouth daily.    Historical Provider, MD  metroNIDAZOLE (FLAGYL) 500 MG tablet Take all four tabs at once Patient not taking: Reported on 04/10/2015 12/08/14   Deirdre C Poe, CNM  Multiple Vitamins-Minerals (MULTIVITAMIN WITH MINERALS) tablet Take 1 tablet by mouth daily.    Historical Provider, MD  pseudoephedrine (SUDAFED) 30 MG tablet Take 1 tablet (30 mg total) by mouth every 4 (four) hours as needed for congestion. 05/08/15   Hanna Patel-Mills, PA-C   BP 121/69 mmHg  Pulse 120  Temp(Src) 99.7 F (37.6 C) (Oral)  Resp 18  SpO2 98% Physical Exam  Constitutional: She is oriented to person, place, and time. She appears well-developed and well-nourished. No distress.  HENT:  Head: Normocephalic and atraumatic.  Right Ear: External ear normal.  Left Ear: External ear normal.  Mouth/Throat: Oropharynx is clear and moist.  Neck: Normal range of motion. Neck supple.  Cardiovascular: Normal rate and regular rhythm.  Exam reveals no gallop and no friction rub.   No murmur heard. Pulmonary/Chest: Effort normal and breath  sounds normal. No respiratory distress. She has no wheezes.  Abdominal: Soft. Bowel sounds are normal. She exhibits no distension. There is no tenderness.  Musculoskeletal: Normal range of motion.  Neurological: She is alert and oriented to person, place, and time.  Skin: Skin is warm and dry. She is not diaphoretic.  Nursing note and vitals reviewed.   ED Course  Procedures (including critical care time) Labs Review Labs Reviewed - No data to display  Imaging Review No results  found.   EKG Interpretation None      MDM   Final diagnoses:  None    This appears to be some sort of reaction to amoxicillin. I will stop this and prescribed Keflex instead. She is to follow-up with her primary Dr. if not improving.    Geoffery Lyons, MD 06/03/15 1254

## 2015-06-03 NOTE — Discharge Instructions (Signed)
Stop taking your amoxicillin. Begin taking Keflex as prescribed.  Continue your prednisone as prescribed.   Sinusitis Sinusitis is redness, soreness, and puffiness (inflammation) of the air pockets in the bones of your face (sinuses). The redness, soreness, and puffiness can cause air and mucus to get trapped in your sinuses. This can allow germs to grow and cause an infection.  HOME CARE   Drink enough fluids to keep your pee (urine) clear or pale yellow.  Use a humidifier in your home.  Run a hot shower to create steam in the bathroom. Sit in the bathroom with the door closed. Breathe in the steam 3-4 times a day.  Put a warm, moist washcloth on your face 3-4 times a day, or as told by your doctor.  Use salt water sprays (saline sprays) to wet the thick fluid in your nose. This can help the sinuses drain.  Only take medicine as told by your doctor. GET HELP RIGHT AWAY IF:   Your pain gets worse.  You have very bad headaches.  You are sick to your stomach (nauseous).  You throw up (vomit).  You are very sleepy (drowsy) all the time.  Your face is puffy (swollen).  Your vision changes.  You have a stiff neck.  You have trouble breathing. MAKE SURE YOU:   Understand these instructions.  Will watch your condition.  Will get help right away if you are not doing well or get worse. Document Released: 05/02/2008 Document Revised: 08/08/2012 Document Reviewed: 06/19/2012 [facility] Patient Information 2015 [place], [place]. This information is not intended to replace advice given to you by your health care provider. Make sure you discuss any questions you have with your health care provider.

## 2015-06-03 NOTE — ED Notes (Signed)
Pt presents to department for possible medication reaction. Currently taking amoxicillin for sinus infection, states she took second dose today and reports nausea, tachycardia and abdominal discomfort. No signs of distress noted. Pt is alert and oriented x4.

## 2015-06-24 ENCOUNTER — Emergency Department (HOSPITAL_COMMUNITY)
Admission: EM | Admit: 2015-06-24 | Discharge: 2015-06-24 | Discharge status: Against Medical Advice | Payer: Self-pay | Source: Home / Self Care

## 2015-06-24 NOTE — ED Notes (Signed)
Called patient name; no answer 

## 2015-06-24 NOTE — ED Notes (Signed)
Called patient name again no answer

## 2015-07-30 ENCOUNTER — Encounter (HOSPITAL_COMMUNITY): Payer: Self-pay | Admitting: Emergency Medicine

## 2015-07-30 ENCOUNTER — Emergency Department (HOSPITAL_COMMUNITY)
Admission: EM | Admit: 2015-07-30 | Discharge: 2015-07-30 | Disposition: A | Discharge status: Home / Self Care | Payer: Self-pay | Attending: Emergency Medicine | Admitting: Emergency Medicine

## 2015-07-30 DIAGNOSIS — K219 Gastro-esophageal reflux disease without esophagitis: Secondary | ICD-10-CM | POA: Insufficient documentation

## 2015-07-30 DIAGNOSIS — Z711 Person with feared health complaint in whom no diagnosis is made: Secondary | ICD-10-CM

## 2015-07-30 DIAGNOSIS — Z91040 Latex allergy status: Secondary | ICD-10-CM | POA: Insufficient documentation

## 2015-07-30 DIAGNOSIS — Z202 Contact with and (suspected) exposure to infections with a predominantly sexual mode of transmission: Secondary | ICD-10-CM | POA: Insufficient documentation

## 2015-07-30 DIAGNOSIS — Z7982 Long term (current) use of aspirin: Secondary | ICD-10-CM | POA: Insufficient documentation

## 2015-07-30 DIAGNOSIS — Z7951 Long term (current) use of inhaled steroids: Secondary | ICD-10-CM | POA: Insufficient documentation

## 2015-07-30 DIAGNOSIS — Z3202 Encounter for pregnancy test, result negative: Secondary | ICD-10-CM | POA: Insufficient documentation

## 2015-07-30 DIAGNOSIS — Z720 Tobacco use: Secondary | ICD-10-CM | POA: Insufficient documentation

## 2015-07-30 LAB — WET PREP, GENITAL
CLUE CELLS WET PREP: NONE SEEN
Trich, Wet Prep: NONE SEEN
Yeast Wet Prep HPF POC: NONE SEEN

## 2015-07-30 LAB — POC URINE PREG, ED: Preg Test, Ur: NEGATIVE

## 2015-07-30 NOTE — ED Notes (Signed)
Pelvic exam is set up.  

## 2015-07-30 NOTE — ED Provider Notes (Signed)
CSN: [id number]     Arrival date & time 07/30/15  1952 History   First MD Initiated Contact with Patient 07/30/15 2005     Chief Complaint  Patient presents with  . Exposure to STD     (Consider location/radiation/quality/duration/timing/severity/associated sxs/prior Treatment) HPI Comments: Patient presents today for a STD check.  She states that she has been sexually active with someone that she is concerned may have a STD.  No known STD exposure.  She denies fever, chills, vaginal discharge, abdominal pain, pelvic pain, or urinary symptoms.  No prior history of STD's.  Patient is a 24 y.o. female presenting with STD exposure. The history is provided by the patient.  Exposure to STD    Past Medical History  Diagnosis Date  . Acid reflux   . IBS (irritable bowel syndrome)   . Vaginal Pap smear, abnormal   . Environmental allergies    Past Surgical History  Procedure Laterality Date  . No past surgeries     Family History  Problem Relation Age of Onset  . Hypertension Other   . Diabetes Other   . Cancer Other    Social History  Substance Use Topics  . Smoking status: Current Every Day Smoker -- 0.25 packs/day for 4 years    Types: Cigars  . Smokeless tobacco: None  . Alcohol Use: Yes     Comment: social   OB History    No data available     Review of Systems  All other systems reviewed and are negative.     Allergies  Doxycycline and Latex  Home Medications   Prior to Admission medications   Medication Sig Start Date End Date Taking? Authorizing Provider  aspirin 325 MG tablet Take 325 mg by mouth every 6 (six) hours as needed for moderate pain.   Yes Historical Provider, MD  calcium carbonate (TUMS - DOSED IN MG ELEMENTAL CALCIUM) 500 MG chewable tablet Chew 1 tablet by mouth daily as needed for indigestion or heartburn.    Yes Historical Provider, MD  estradiol cypionate (DEPO-ESTRADIOL) 5 MG/ML injection Inject 1.5 mg into the muscle every 28  (twenty-eight) days.   Yes Historical Provider, MD  fluticasone (FLONASE) 50 MCG/ACT nasal spray Place 2 sprays into both nostrils daily. Patient taking differently: Place 2 sprays into both nostrils daily as needed for allergies.  04/10/15  Yes Samuel Jester, DO  cephALEXin (KEFLEX) 500 MG capsule Take 1 capsule (500 mg total) by mouth 4 (four) times daily. Patient not taking: Reported on 07/30/2015 06/03/15   Geoffery Lyons, MD  ciprofloxacin-hydrocortisone (CIPRO [facility]) otic suspension Place 3 drops into both ears 2 (two) times daily. Patient not taking: Reported on 07/30/2015 05/08/15   Catha Gosselin, PA-C  ibuprofen (ADVIL,MOTRIN) 600 MG tablet Take 1 tablet (600 mg total) by mouth every 6 (six) hours as needed. Patient not taking: Reported on 04/10/2015 07/14/14   Brandt Loosen, MD  metroNIDAZOLE (FLAGYL) 500 MG tablet Take all four tabs at once Patient not taking: Reported on 04/10/2015 12/08/14   Deirdre C Poe, CNM  pseudoephedrine (SUDAFED) 30 MG tablet Take 1 tablet (30 mg total) by mouth every 4 (four) hours as needed for congestion. Patient not taking: Reported on 07/30/2015 05/08/15   Hanna Patel-Mills, PA-C   BP 129/64 mmHg  Pulse 103  Temp(Src) 98.7 F (37.1 C) (Oral)  Resp 16  SpO2 100% Physical Exam  Constitutional: She appears well-developed and well-nourished.  HENT:  Head: Normocephalic and atraumatic.  Neck: Normal range  of motion. Neck supple.  Cardiovascular: Normal rate, regular rhythm and normal heart sounds.   Pulmonary/Chest: Effort normal and breath sounds normal.  Abdominal: Soft. Bowel sounds are normal. She exhibits no distension and no mass. There is no tenderness. There is no rebound and no guarding.  Genitourinary: Cervix exhibits no motion tenderness. Right adnexum displays no mass, no tenderness and no fullness. Left adnexum displays no mass, no tenderness and no fullness.  Neurological: She is alert.  Skin: Skin is warm and dry.  Psychiatric: She has a normal mood  and affect.  Nursing note and vitals reviewed.   ED Course  Procedures (including critical care time) Labs Review Labs Reviewed  WET PREP, GENITAL  RPR  HIV ANTIBODY (ROUTINE TESTING)  POC URINE PREG, ED  GC/CHLAMYDIA PROBE AMP ([place]) NOT AT [facility]    Imaging Review No results found. I have personally reviewed and evaluated these images and lab results as part of my medical decision-making.   EKG Interpretation None      MDM   Final diagnoses:  None   Patient presents today for a STD check.  She thinks that a sexual partner may have had a STD.  No known exposure.  She is asymptomatic.  Urine pregnancy negative.  No CMT, abnormal discharge, or adnexal tenderness with pelvic exam.  Wet prep unremarkable.  GC/Chlamydia, RPR, and HIV pending.  Patient stable for discharge.  Return precautions given.    Santiago Glad, PA-C 07/31/15 1610  Bethann Berkshire, MD 07/31/15 1059

## 2015-07-30 NOTE — ED Notes (Signed)
Pt states she heard someone she had been intimate with had a STD and she would like to be checked  Pt denies any sxs

## 2015-07-30 NOTE — Discharge Instructions (Signed)
You have been treated in the emergency department for an infection, possibly sexually transmitted. Results of your gonorrhea and chlamydia tests are pending and you will be notified if they are positive. It is very important to practice safe sex and use condoms when sexually active. If your results are positive you need to notify all sexual partners so they can be treated as well. The website http://www.dontspreadit.com/ can be used to send anonymous text messages or emails to alert sexual contacts. Follow up with your doctor, or OBGYN in regards to today's visit.   ° °Gonorrhea and Chlamydia °SYMPTOMS  °In females, symptoms may go unnoticed. Symptoms that are more noticeable can include:  °Belly (abdominal) pain.  °Painful intercourse.  °Watery mucous-like discharge from the vagina.  °Miscarriage.  °Discomfort when urinating.  °Inflammation of the rectum.  °Abnormal gray-green frothy vaginal discharge  °Vaginal itching and irritatio  °Itching and irritation of the area outside the vagina.   °Painful urination.  °Bleeding after sexual intercourse.  °In males, symptoms include:  °Burning with urination.  °Pain in the testicles.  °Watery mucous-like discharge from the penis.  °It can cause longstanding (chronic) pelvic pain after frequent infections.  °TREATMENT  °PID can cause women to not be able to have children (sterile) if left untreated or if half-treated.  It is important to finish ALL medications given to you.  °This is a sexually transmitted infection. So you are also at risk for other sexually transmitted diseases, including HIV (AIDS), it is recommended that you get tested. °HOME CARE INSTRUCTIONS  °Warning: This infection is contagious. Do not have sex until treatment is completed. Follow up at your caregiver's office or the clinic to which you were referred. If your diagnosis (learning what is wrong) is confirmed by culture or some other method, your recent sexual contacts need treatment. Even if they are  symptom free or have a negative culture or evaluation, they should be treated.  °PREVENTION  °Women should use sanitary pads instead of tampons for vaginal discharge.  °Wipe front to back after using the toilet and avoid douching.   °Practice safe sex, use condoms, have only one sex partner and be sure your sex partner is not having sex with others.  °Ask your caregiver to test you for chlamydia at your regular checkups or sooner if you are having symptoms.  °Ask for further information if you are pregnant.  °SEEK IMMEDIATE MEDICAL CARE IF:  °You develop an oral temperature above 102° F (38.9° C), not controlled by medications or lasting more than 2 days.  °You develop an increase in pain.  °You develop any type of abnormal discharge.  °You develop vaginal bleeding and it is not time for your period.  °You develop painful intercourse.  °  ° ° °RESOURCE GUIDE ° °Dental Problems ° °Patients with Medicaid: °[facility]                     [facility] °[address]                                           [address] °Phone:  [phone]                                                    Phone:  510-2600 ° °If unable to pay or uninsured, contact:  [facility] or [facility] to become qualified for the adult dental clinic. ° °Chronic Pain Problems °Contact [facility]  [phone] °Patients need to be referred by their primary care doctor. ° °Insufficient Money for Medicine °Contact [employer]:  call "211" or [facility] 271-5999. ° °No Primary Care Doctor °Call [facility]  832-8000 °Other agencies that provide inexpensive medical care °   [facility]  [phone] °   [facility]  832-7272 °   [facility]  [phone] °   [facility]  832-4777 °   [facility]  373-0678 °   [facility]  [phone] ° °Psychological Services °[facility]  [phone] °[facility]   [phone] °[facility]   [phone] (emergency services 641-4993) ° °Substance Abuse Resources °[facility]  336-882-2125 °[facility] 336-784-9470 °[facility] 336-285-9073 °[facility] 336-845-3988 °Residential & Outpatient Substance Abuse Program  800-659-3381 ° °Abuse/Neglect °[place] [facility] Hot[facility] (336) 641-3795 °[place] [facility] Hotline 800-378-5315 (After Hours) ° °Emergency Shelter °[facility] (336) 271-5985 ° °Maternity Homes °[facility] (336) 275-9566 °[facility] (704) 372-4663 ° °MRSA Hotline #:   832-7006 ° ° ° °[place] [facility] ° °[facility] [place] [facility]     [employer]                          [facility] °[address] [place]                       [address]      [address]  °[place]                                                [place]                            [place] °Phone:  349-3220                                   Phone:  [phone]                 Phone:  342-8140 ° °[facility] °Phone:  342-8316 ° °[place] Child Abuse Hotline °(336) 342-1394 °(336) 342-3537 (After Hours) ° ° °

## 2015-07-31 LAB — GC/CHLAMYDIA PROBE AMP (~~LOC~~) NOT AT ARMC
CHLAMYDIA, DNA PROBE: NEGATIVE
Neisseria Gonorrhea: NEGATIVE

## 2015-07-31 LAB — HIV ANTIBODY (ROUTINE TESTING W REFLEX): HIV SCREEN 4TH GENERATION: NONREACTIVE

## 2015-07-31 LAB — RPR: RPR: NONREACTIVE

## 2015-08-05 ENCOUNTER — Emergency Department (HOSPITAL_COMMUNITY)
Admission: EM | Admit: 2015-08-05 | Discharge: 2015-08-05 | Disposition: A | Discharge status: Home / Self Care | Payer: Self-pay | Attending: Emergency Medicine | Admitting: Emergency Medicine

## 2015-08-05 ENCOUNTER — Encounter (HOSPITAL_COMMUNITY): Payer: Self-pay | Admitting: Emergency Medicine

## 2015-08-05 DIAGNOSIS — K2970 Gastritis, unspecified, without bleeding: Secondary | ICD-10-CM | POA: Insufficient documentation

## 2015-08-05 DIAGNOSIS — Z7982 Long term (current) use of aspirin: Secondary | ICD-10-CM | POA: Insufficient documentation

## 2015-08-05 DIAGNOSIS — R112 Nausea with vomiting, unspecified: Secondary | ICD-10-CM

## 2015-08-05 DIAGNOSIS — K219 Gastro-esophageal reflux disease without esophagitis: Secondary | ICD-10-CM | POA: Insufficient documentation

## 2015-08-05 DIAGNOSIS — Z91040 Latex allergy status: Secondary | ICD-10-CM | POA: Insufficient documentation

## 2015-08-05 DIAGNOSIS — Z7951 Long term (current) use of inhaled steroids: Secondary | ICD-10-CM | POA: Insufficient documentation

## 2015-08-05 DIAGNOSIS — Z720 Tobacco use: Secondary | ICD-10-CM | POA: Insufficient documentation

## 2015-08-05 MED ORDER — FAMOTIDINE 20 MG PO TABS: 20.0000 mg | ORAL_TABLET | Freq: Two times a day (BID) | ORAL | Status: DC

## 2015-08-05 MED ORDER — ONDANSETRON 4 MG PO TBDP: 4.0000 mg | ORAL_TABLET | ORAL | Status: DC | PRN

## 2015-08-05 NOTE — ED Provider Notes (Signed)
CSN: [id number]     Arrival date & time 08/05/15  4098 History   First MD Initiated Contact with Patient 08/05/15 [phone]     Chief Complaint  Patient presents with  . Emesis  . Diarrhea  . Abdominal Pain     (Consider location/radiation/quality/duration/timing/severity/associated sxs/prior Treatment) HPI Port[place] that she's had cramping abdominal pain and vomiting and diarrhea particularly after eating since eating [place] food that she had left out on Thursday. That was 6 days ago. She states that the symptoms occur if she eats some solid food she'll get generalized cramping in the abdomen. She did not have any diarrheal stool yesterday. She reports one episode of loose stool. She does however clarify that she tried a laxitive of 4 days ago thinking that she might be constipated. No fever. No pain or vomiting except with eating solid foods. Past Medical History  Diagnosis Date  . Acid reflux   . IBS (irritable bowel syndrome)   . Vaginal Pap smear, abnormal   . Environmental allergies    Past Surgical History  Procedure Laterality Date  . No past surgeries     Family History  Problem Relation Age of Onset  . Hypertension Other   . Diabetes Other   . Cancer Other    Social History  Substance Use Topics  . Smoking status: Current Every Day Smoker -- 0.25 packs/day for 4 years    Types: Cigars  . Smokeless tobacco: None  . Alcohol Use: Yes     Comment: social   OB History    No data available     Review of Systems 10 Systems reviewed and are negative for acute change except as noted in the HPI.    Allergies  Doxycycline and Latex  Home Medications   Prior to Admission medications   Medication Sig Start Date End Date Taking? Authorizing Provider  aspirin 325 MG tablet Take 325 mg by mouth every 6 (six) hours as needed for moderate pain.    Historical Provider, MD  calcium carbonate (TUMS - DOSED IN MG ELEMENTAL CALCIUM) 500 MG chewable tablet Chew 1 tablet by mouth  daily as needed for indigestion or heartburn.     Historical Provider, MD  cephALEXin (KEFLEX) 500 MG capsule Take 1 capsule (500 mg total) by mouth 4 (four) times daily. Patient not taking: Reported on 07/30/2015 06/03/15   Geoffery Lyons, MD  ciprofloxacin-hydrocortisone (CIPRO [facility]) otic suspension Place 3 drops into both ears 2 (two) times daily. Patient not taking: Reported on 07/30/2015 05/08/15   Catha Gosselin, PA-C  estradiol cypionate (DEPO-ESTRADIOL) 5 MG/ML injection Inject 1.5 mg into the muscle every 28 (twenty-eight) days.    Historical Provider, MD  famotidine (PEPCID) 20 MG tablet Take 1 tablet (20 mg total) by mouth 2 (two) times daily. 08/05/15   Arby Barrette, MD  fluticasone (FLONASE) 50 MCG/ACT nasal spray Place 2 sprays into both nostrils daily. Patient taking differently: Place 2 sprays into both nostrils daily as needed for allergies.  04/10/15   Samuel Jester, DO  ibuprofen (ADVIL,MOTRIN) 600 MG tablet Take 1 tablet (600 mg total) by mouth every 6 (six) hours as needed. Patient not taking: Reported on 04/10/2015 07/14/14   Brandt Loosen, MD  metroNIDAZOLE (FLAGYL) 500 MG tablet Take all four tabs at once Patient not taking: Reported on 04/10/2015 12/08/14   Deirdre C Poe, CNM  ondansetron (ZOFRAN ODT) 4 MG disintegrating tablet Take 1 tablet (4 mg total) by mouth every 4 (four) hours as needed for  nausea or vomiting. 08/05/15   Arby Barrette, MD  pseudoephedrine (SUDAFED) 30 MG tablet Take 1 tablet (30 mg total) by mouth every 4 (four) hours as needed for congestion. Patient not taking: Reported on 07/30/2015 05/08/15   Hanna Patel-Mills, PA-C   BP 136/75 mmHg  Pulse 90  Temp(Src) 98.3 F (36.8 C) (Oral)  Resp 16  Ht 5\' 4"  (1.626 m)  Wt 160 lb (72.576 kg)  BMI 27.45 kg/m2  SpO2 100% Physical Exam  Constitutional: She is oriented to person, place, and time. She appears well-developed and well-nourished.  HENT:  Head: Normocephalic and atraumatic.  Eyes: EOM are normal. Pupils  are equal, round, and reactive to light.  Neck: Neck supple.  Cardiovascular: Normal rate, regular rhythm, normal heart sounds and intact distal pulses.   Pulmonary/Chest: Effort normal and breath sounds normal.  Abdominal: Soft. Bowel sounds are normal. She exhibits no distension. There is no tenderness.  Musculoskeletal: Normal range of motion. She exhibits no edema.  Neurological: She is alert and oriented to person, place, and time. She has normal strength. Coordination normal. GCS eye subscore is 4. GCS verbal subscore is 5. GCS motor subscore is 6.  Skin: Skin is warm, dry and intact.  Psychiatric: She has a normal mood and affect.    ED Course  Procedures (including critical care time) Labs Review Labs Reviewed - No data to display  Imaging Review No results found. I have personally reviewed and evaluated these images and lab results as part of my medical decision-making.   EKG Interpretation None      MDM   Final diagnoses:  Gastritis  Non-intractable vomiting with nausea, vomiting of unspecified type   Patient has well appearance. Abdomen is soft and nontender. Vital signs are stable without any clinical evidence of dehydration. When pain occurs it is generalized and cramping in nature. Patient is counseled at this point time for Pepcid and Zofran. Also she will resume a diet fat free. Patient is counseled on signs and symptoms of biliary colic. At this time differential is for a food-related gastritis versus possible biliary colic although when the pain occurs it is generalized in nature. Patient's counseled on follow-up with family physician to monitor response to treatment. She is provided with [place] and wellness information for follow-up.    Arby Barrette, MD 08/05/15 [phone]

## 2015-08-05 NOTE — ED Notes (Signed)
Pt reports eating [place] food last Thursday and ever since then has had emesis and diarrhea when she attempts to eat anything. Left food out without putting it back in the refrigerator when she ate it again Friday.Tried to take a laxative. Still having emesis and diarrhea. No active vomiting at this time. No other c/c.

## 2015-08-05 NOTE — Discharge Instructions (Signed)
Gastritis, Adult Gastritis is soreness and swelling (inflammation) of the lining of the stomach. Gastritis can develop as a sudden onset (acute) or long-term (chronic) condition. If gastritis is not treated, it can lead to stomach bleeding and ulcers. CAUSES  Gastritis occurs when the stomach lining is weak or damaged. Digestive juices from the stomach then inflame the weakened stomach lining. The stomach lining may be weak or damaged due to viral or bacterial infections. One common bacterial infection is the Helicobacter pylori infection. Gastritis can also result from excessive alcohol consumption, taking certain medicines, or having too much acid in the stomach.  SYMPTOMS  In some cases, there are no symptoms. When symptoms are present, they may include:  Pain or a burning sensation in the upper abdomen.  Nausea.  Vomiting.  An uncomfortable feeling of fullness after eating. DIAGNOSIS  Your caregiver may suspect you have gastritis based on your symptoms and a physical exam. To determine the cause of your gastritis, your caregiver may perform the following:  Blood or stool tests to check for the H pylori bacterium.  Gastroscopy. A thin, flexible tube (endoscope) is passed down the esophagus and into the stomach. The endoscope has a light and camera on the end. Your caregiver uses the endoscope to view the inside of the stomach.  Taking a tissue sample (biopsy) from the stomach to examine under a microscope. TREATMENT  Depending on the cause of your gastritis, medicines may be prescribed. If you have a bacterial infection, such as an H pylori infection, antibiotics may be given. If your gastritis is caused by too much acid in the stomach, H2 blockers or antacids may be given. Your caregiver may recommend that you stop taking aspirin, ibuprofen, or other nonsteroidal anti-inflammatory drugs (NSAIDs). HOME CARE INSTRUCTIONS  Only take over-the-counter or prescription medicines as directed by  your caregiver.  If you were given antibiotic medicines, take them as directed. Finish them even if you start to feel better.  Drink enough fluids to keep your urine clear or pale yellow.  Avoid foods and drinks that make your symptoms worse, such as:  Caffeine or alcoholic drinks.  Chocolate.  Peppermint or mint flavorings.  Garlic and onions.  Spicy foods.  Citrus fruits, such as oranges, lemons, or limes.  Tomato-based foods such as sauce, chili, salsa, and pizza.  Fried and fatty foods.  Eat small, frequent meals instead of large meals. SEEK IMMEDIATE MEDICAL CARE IF:   You have black or dark red stools.  You vomit blood or material that looks like coffee grounds.  You are unable to keep fluids down.  Your abdominal pain gets worse.  You have a fever.  You do not feel better after 1 week.  You have any other questions or concerns. MAKE SURE YOU:  Understand these instructions.  Will watch your condition.  Will get help right away if you are not doing well or get worse. Document Released: 11/08/2001 Document Revised: 05/15/2012 Document Reviewed: 12/28/2011 [facility] Patient Information 2015 [place], [place]. This information is not intended to replace advice given to you by your health care provider. Make sure you discuss any questions you have with your health care provider. Possible Biliary Colic  Biliary colic is a steady or irregular pain in the upper abdomen. It is usually under the right side of the rib cage. It happens when gallstones interfere with the normal flow of bile from the gallbladder. Bile is a liquid that helps to digest fats. Bile is made in the  liver and stored in the gallbladder. When you eat a meal, bile passes from the gallbladder through the cystic duct and the common bile duct into the small intestine. There, it mixes with partially digested food. If a gallstone blocks either of these ducts, the normal flow of bile is blocked. The muscle  cells in the bile duct contract forcefully to try to move the stone. This causes the pain of biliary colic.  SYMPTOMS   A person with biliary colic usually complains of pain in the upper abdomen. This pain can be:  In the center of the upper abdomen just below the breastbone.  In the upper-right part of the abdomen, near the gallbladder and liver.  Spread back toward the right shoulder blade.  Nausea and vomiting.  The pain usually occurs after eating.  Biliary colic is usually triggered by the digestive system's demand for bile. The demand for bile is high after fatty meals. Symptoms can also occur when a person who has been fasting suddenly eats a very large meal. Most episodes of biliary colic pass after 1 to 5 hours. After the most intense pain passes, your abdomen may continue to ache mildly for about 24 hours. DIAGNOSIS  After you describe your symptoms, your caregiver will perform a physical exam. He or she will pay attention to the upper right portion of your belly (abdomen). This is the area of your liver and gallbladder. An ultrasound will help your caregiver look for gallstones. Specialized scans of the gallbladder may also be done. Blood tests may be done, especially if you have fever or if your pain persists. PREVENTION  Biliary colic can be prevented by controlling the risk factors for gallstones. Some of these risk factors, such as heredity, increasing age, and pregnancy are a normal part of life. Obesity and a high-fat diet are risk factors you can change through a healthy lifestyle. Women going through menopause who take hormone replacement therapy (estrogen) are also more likely to develop biliary colic. TREATMENT   Pain medication may be prescribed.  You may be encouraged to eat a fat-free diet.  If the first episode of biliary colic is severe, or episodes of colic keep retuning, surgery to remove the gallbladder (cholecystectomy) is usually recommended. This procedure  can be done through small incisions using an instrument called a laparoscope. The procedure often requires a brief stay in the hospital. Some people can leave the hospital the same day. It is the most widely used treatment in people troubled by painful gallstones. It is effective and safe, with no complications in more than 90% of cases.  If surgery cannot be done, medication that dissolves gallstones may be used. This medication is expensive and can take months or years to work. Only small stones will dissolve.  Rarely, medication to dissolve gallstones is combined with a procedure called shock-wave lithotripsy. This procedure uses carefully aimed shock waves to break up gallstones. In many people treated with this procedure, gallstones form again within a few years. PROGNOSIS  If gallstones block your cystic duct or common bile duct, you are at risk for repeated episodes of biliary colic. There is also a 25% chance that you will develop a gallbladder infection(acute cholecystitis), or some other complication of gallstones within 10 to 20 years. If you have surgery, schedule it at a time that is convenient for you and at a time when you are not sick. HOME CARE INSTRUCTIONS   Drink plenty of clear fluids.  Avoid fatty, greasy or  fried foods, or any foods that make your pain worse.  Take medications as directed. SEEK MEDICAL CARE IF:   You develop a fever over 100.5 F (38.1 C).  Your pain gets worse over time.  You develop nausea that prevents you from eating and drinking.  You develop vomiting. SEEK IMMEDIATE MEDICAL CARE IF:   You have continuous or severe belly (abdominal) pain which is not relieved with medications.  You develop nausea and vomiting which is not relieved with medications.  You have symptoms of biliary colic and you suddenly develop a fever and shaking chills. This may signal cholecystitis. Call your caregiver immediately.  You develop a yellow color to your skin or  the white part of your eyes (jaundice). Document Released: 04/17/2006 Document Revised: 02/06/2012 Document Reviewed: 06/26/2008 [facility] Patient Information 2015 [place], [place]. This information is not intended to replace advice given to you by your health care provider. Make sure you discuss any questions you have with your health care provider.  Emergency Department Resource Guide 1) Find a Doctor and Pay Out of Pocket Although you won't have to find out who is covered by your insurance plan, it is a good idea to ask around and get recommendations. You will then need to call the office and see if the doctor you have chosen will accept you as a new patient and what types of options they offer for patients who are self-pay. Some doctors offer discounts or will set up payment plans for their patients who do not have insurance, but you will need to ask so you aren't surprised when you get to your appointment.  2) Contact Your Local Health Department Not all health departments have doctors that can see patients for sick visits, but many do, so it is worth a call to see if yours does. If you don't know where your local health department is, you can check in your phone book. The CDC also has a tool to help you locate your state's health department, and many state websites also have listings of all of their local health departments.  3) Find a Walk-in Clinic If your illness is not likely to be very severe or complicated, you may want to try a walk in clinic. These are popping up all over the country in pharmacies, drugstores, and shopping centers. They're usually staffed by nurse practitioners or physician assistants that have been trained to treat common illnesses and complaints. They're usually fairly quick and inexpensive. However, if you have serious medical issues or chronic medical problems, these are probably not your best option.  No Primary Care Doctor: - Call Health Connect at  [phone] - they  can help you locate a primary care doctor that  accepts your insurance, provides certain services, etc. - Physician Referral Service- [phone]  Chronic Pain Problems: Organization         Address  Phone   Notes  [facility]  [phone] Patients need to be referred by their primary care doctor.   Medication Assistance: Organization         Address  Phone   Notes  [facility] [address] [place], [place] [address] [phone] --Must be a resident of [facility] -- Must have NO insurance coverage whatsoever (no Medicaid/ Medicare, etc.) -- The pt. MUST have a primary care doctor that directs their care regularly and follows them in the community   [facility]  [phone]   [place]  [address]  [phone]    Agencies that provide inexpensive medical care: Organization         Address  Phone   Notes  [facility]  [phone]   [facility]    [phone]   [facility] [address] [place], [place] [address] [phone]   [facility] [address] [place] [place] [phone]   Planned Parenthood    [phone]   [facility]    [phone]   [facility]  [address], [place] Phone:  [phone], Fax:  [phone] Hours of Operation:  9 am - 6 pm, M-F.  Also accepts Medicaid/Medicare and self-pay.  [facility]  [address], [place] Phone: [phone], Fax: [phone]. Hours of Operation:  8:30 am - 5:30 pm, M-F.  Also accepts Medicaid and self-pay.  [facility] [place] [address], [place] Phone: [phone]   [facility] [address] Natasha Bence [place], [place] [phone], Ext. [phone] Mondays & Thursdays: 7-9 AM.  First 15 patients are seen on a first come, first serve basis.    Medicaid-accepting  [facility] Providers:  Organization         Address  Phone   Notes  [facility] [address], [place] [phone] Also accepts self-pay patients.  [facility] [address] Laurell Josephs [place], [place]  [phone]   [facility] [address], [place] [phone]   [facility] [address] [place] [phone]   Renaye Rakers [address], [place]   [phone] Only accepts [place] Access [place] patients after they have their name applied to their card.   Self-Pay (no insurance) in [facility]:  Organization         Address  Phone   Notes  Sickle Cell Patients, [facility] [address] [place], [place] [phone]   [facility] [address] [place], [place] [phone]   [facility]  [address], [place] [phone]   [facility]/Dr. Osei-Bonsu  [address] [place] or [address], [place] [phone] Phone number for both [place] and [place] locations is the same.  [facility] [address], [place] [phone]   [facility] [address], [place] or [address] [phone]   [facility] [address], [place] [phone], phone; [phone], fax Sees patients 1st and 3rd Saturday of every month.  Must not qualify for public or private insurance (i.e. Medicaid, Medicare, [place] Health Choice, Veterans' Benefits)  Household income should be no more than 200% of the poverty level The clinic cannot treat you if you are pregnant or think you are pregnant  Sexually transmitted diseases are not treated at the clinic.    Dental Care: Organization         Address  Phone  Notes  [facility] of [facility] [address] [place], [place] [phone] Accepts children up to age 91 who are enrolled in [place] or [place] Health Choice; pregnant women with a Medicaid card; and  children who have applied for Medicaid or [place] Health Choice, but were declined, whose parents can pay a reduced fee at time of service.  [facility] of [facility]  [address], [place] [phone] Accepts children up to age 25 who are enrolled in [place] or [place] Health Choice; pregnant women with a Medicaid card; and children who have applied for Medicaid or [place] Health Choice, but were declined, whose parents can pay a reduced fee at time of service.  [facility]  [address] [place], [place] [phone] Patients are seen by appointment only. Walk-ins are not accepted. [facility] will see patients 51 years of age and older. Monday - Tuesday (8am-5pm) Most Wednesdays (8:30-5pm) $30 per visit, cash only  [facility] Adult Dental Access [facility]  [address], [facility] [phone] Patients are seen by appointment only. Walk-ins are not accepted. [facility] will see patients 12 years of age and older. One Wednesday Evening (Monthly: Volunteer Based).  $30 per visit, cash only  [employer]  [phone] for adults; Children under age 58, call Graduate Pediatric Dentistry at [phone]. Children aged 63-14, please call [phone] to request a pediatric application.  Dental services are provided in all areas of dental care including fillings, crowns and bridges, complete and partial dentures, implants, gum treatment, root canals, and extractions. Preventive care is also provided. Treatment is provided to both adults and children. Patients are selected via a lottery and there is often a waiting list.   [facility] [address] [place]  [phone] [email or web address]   [facility] [address] [place], [place] [phone], Ext. [phone] Second and Fourth Thursday of each month, opens at 6:30 AM; Clinic ends at 9 AM.  Patients are seen on a first-come first-served basis, and a limited number are seen during each clinic.   [facility]  [address] Ether Griffins [place], [place] [phone]   Eligibility Requirements You must have lived in [place], [place], or [place] counties for at least the last three months.   You cannot be eligible for state or federal sponsored [place], including [employer], [place], or [employer].   You generally cannot be eligible for healthcare insurance through your employer.    How to apply: Eligibility screenings are held every Tuesday and Wednesday afternoon from 1:00 pm until 4:00 pm. You do not need an appointment for the interview!  [facility] [address] [place], [place] [phone]   [facility] Health [facility]  [phone]   [facility] Health Department  [phone]   [facility] Health Department  [phone]    Behavioral Health Resources in the Community: Intensive Outpatient Programs Organization         Address  Phone  Notes  [facility] [address], [place], [place] [phone]   [facility] Outpatient [address], [place], [place] [phone]   [facility] [address] [place] [address], [place], [place]  [phone]   [facility] [address]  [place], [place] [phone] or [phone]   Substance Abuse Resources Organization         Address  Phone  Notes  [facility]  [phone]   [facility]  [phone]   [facility]  [phone]   [facility]  [phone]   Residential &  Outpatient Substance Abuse Program  [phone]   Psychological Services Organization         Address  Phone  Notes  [facility] Behavioral Health   [phone]   [facility] Services  [phone]   [facility] [phone] [address] [place] [phone] or [phone]    Mobile Crisis Teams Organization         Address  Phone  Notes  [facility], Mobile Crisis Care Unit  [phone]   [facility]  [address] [place], [place] [phone]   Doristine Locks [address] [place] [phone]    Self-Help/Support Groups Organization         Address  Phone             Notes  [facility] [place] - variety of support groups  336- I7437963 Call for more information  Narcotics Anonymous (NA), [facility] [address], [employer] [place]  2 meetings at this location   Statistician         Address  Phone  Notes  [facility] [address],    [place]  [phone]   [facility]  [address] [place] [address], [place], [place] [phone]   [facility] [address] [place], [place] [phone] Admissions: 8am-3pm M-F  [facility] [address]    [place], [place] [phone]   [facility] [address] [place], [place], [place] [phone]   [facility] [address]  [place], [place] [phone]   [facility] - Intensive Outpatient [address] Laurell Josephs [address], [place], [place] [phone]   [facility] ([facility] [address] [place]  [place], [place] [phone] or [phone]   [facility] ([facility]) [address] [place], [place] [phone] Accepts Medicaid  [facility] [address],  [place] [phone] Substance Abuse/Addiction Treatment   [facility] Organization         Address  Phone  Notes  [facility]  4583001154   Angie Fava, PhD [address] Ervin Knack [place], [place]   [phone] or  [phone]   [facility] Behavioral   [address] [place], [place] ([phone]   [facility] [address] [place], [place] [phone] Insurance/Medicaid/sponsorship through [facility] [address]                                    [place], [place] [phone] Therapy/tele-psych/case  [facility] [address], [place] [phone]    Dr. Lolly Mustache  [phone]   [facility]  [employer] [facility] [place] [facility] Dept. 1) [address], [place] 2) [address], [place] 3)  [address], [place] [phone]  [phone]   [facility] Child Abuse Hotline [phone] or [phone] (After Hours)

## 2015-09-28 ENCOUNTER — Emergency Department (HOSPITAL_COMMUNITY): Payer: Self-pay

## 2015-09-28 ENCOUNTER — Encounter (HOSPITAL_COMMUNITY): Payer: Self-pay | Admitting: *Deleted

## 2015-09-28 ENCOUNTER — Emergency Department (HOSPITAL_COMMUNITY)
Admission: EM | Admit: 2015-09-28 | Discharge: 2015-09-28 | Disposition: A | Discharge status: Home / Self Care | Payer: Self-pay | Attending: Emergency Medicine | Admitting: Emergency Medicine

## 2015-09-28 DIAGNOSIS — Z79899 Other long term (current) drug therapy: Secondary | ICD-10-CM | POA: Insufficient documentation

## 2015-09-28 DIAGNOSIS — Z91040 Latex allergy status: Secondary | ICD-10-CM | POA: Insufficient documentation

## 2015-09-28 DIAGNOSIS — Z720 Tobacco use: Secondary | ICD-10-CM | POA: Insufficient documentation

## 2015-09-28 DIAGNOSIS — K219 Gastro-esophageal reflux disease without esophagitis: Secondary | ICD-10-CM | POA: Insufficient documentation

## 2015-09-28 DIAGNOSIS — Z3202 Encounter for pregnancy test, result negative: Secondary | ICD-10-CM | POA: Insufficient documentation

## 2015-09-28 DIAGNOSIS — K2970 Gastritis, unspecified, without bleeding: Secondary | ICD-10-CM | POA: Insufficient documentation

## 2015-09-28 LAB — CBC
HCT: 40.7 % (ref 36.0–46.0)
Hemoglobin: 13.2 g/dL (ref 12.0–15.0)
MCH: 27.8 pg (ref 26.0–34.0)
MCHC: 32.4 g/dL (ref 30.0–36.0)
MCV: 85.7 fL (ref 78.0–100.0)
PLATELETS: 255 10*3/uL (ref 150–400)
RBC: 4.75 MIL/uL (ref 3.87–5.11)
RDW: 12.9 % (ref 11.5–15.5)
WBC: 7.4 10*3/uL (ref 4.0–10.5)

## 2015-09-28 LAB — URINALYSIS, ROUTINE W REFLEX MICROSCOPIC
BILIRUBIN URINE: NEGATIVE
GLUCOSE, UA: NEGATIVE mg/dL
Hgb urine dipstick: NEGATIVE
KETONES UR: NEGATIVE mg/dL
Leukocytes, UA: NEGATIVE
Nitrite: NEGATIVE
PH: 8 (ref 5.0–8.0)
Protein, ur: NEGATIVE mg/dL
SPECIFIC GRAVITY, URINE: 1.011 (ref 1.005–1.030)
UROBILINOGEN UA: 0.2 mg/dL (ref 0.0–1.0)

## 2015-09-28 LAB — COMPREHENSIVE METABOLIC PANEL
ALBUMIN: 4.4 g/dL (ref 3.5–5.0)
ALK PHOS: 53 U/L (ref 38–126)
ALT: 30 U/L (ref 14–54)
ANION GAP: 5 (ref 5–15)
AST: 30 U/L (ref 15–41)
BILIRUBIN TOTAL: 0.9 mg/dL (ref 0.3–1.2)
BUN: 9 mg/dL (ref 6–20)
CALCIUM: 9.2 mg/dL (ref 8.9–10.3)
CO2: 26 mmol/L (ref 22–32)
CREATININE: 0.78 mg/dL (ref 0.44–1.00)
Chloride: 107 mmol/L (ref 101–111)
GFR calc Af Amer: >60 mL/min (ref >60)
GFR calc non Af Amer: >60 mL/min (ref >60)
GLUCOSE: 89 mg/dL (ref 65–99)
Potassium: 4 mmol/L (ref 3.5–5.1)
Sodium: 138 mmol/L (ref 135–145)
TOTAL PROTEIN: 7.6 g/dL (ref 6.5–8.1)

## 2015-09-28 LAB — POC URINE PREG, ED: Preg Test, Ur: NEGATIVE

## 2015-09-28 LAB — LIPASE, BLOOD: Lipase: 30 U/L (ref 11–51)

## 2015-09-28 MED ORDER — RANITIDINE HCL 150 MG PO CAPS: 150.0000 mg | ORAL_CAPSULE | Freq: Two times a day (BID) | ORAL | Status: DC

## 2015-09-28 NOTE — Discharge Instructions (Signed)
Follow up with [place] [facility] in 2-3 weeks

## 2015-09-28 NOTE — ED Notes (Signed)
Pt reported abd pain and bloating. Pt reported abd pain and nausea with meals and indigestion. Pt denies n/v/d. Abd soft/nondistended/nontender to palpate. BS (+) and active x 4 quadrants.

## 2015-09-28 NOTE — ED Notes (Signed)
Awake. Verbally responsive. A/O x4. Resp even and unlabored. No audible adventitious breath sounds noted. ABC's intact.  

## 2015-09-28 NOTE — ED Provider Notes (Signed)
CSN: [id number]     Arrival date & time 09/28/15  1101 History   First MD Initiated Contact with Patient 09/28/15 1449     Chief Complaint  Patient presents with  . Abdominal Pain     (Consider location/radiation/quality/duration/timing/severity/associated sxs/prior Treatment) Patient is a 24 y.o. female presenting with abdominal pain. The history is provided by the patient (Patient complains of upper abdominal pain and burning sometimes.).  Abdominal Pain Pain location:  Epigastric Pain quality: aching   Pain radiates to:  Does not radiate Onset quality:  Gradual Timing:  Intermittent Chronicity:  Recurrent Context: not alcohol use   Associated symptoms: no chest pain, no cough, no diarrhea, no fatigue and no hematuria     Past Medical History  Diagnosis Date  . Acid reflux   . IBS (irritable bowel syndrome)   . Vaginal Pap smear, abnormal   . Environmental allergies    Past Surgical History  Procedure Laterality Date  . No past surgeries     Family History  Problem Relation Age of Onset  . Hypertension Other   . Diabetes Other   . Cancer Other    Social History  Substance Use Topics  . Smoking status: Current Every Day Smoker -- 0.25 packs/day for 4 years    Types: Cigars  . Smokeless tobacco: None  . Alcohol Use: Yes     Comment: social   OB History    No data available     Review of Systems  Constitutional: Negative for appetite change and fatigue.  HENT: Negative for congestion, ear discharge and sinus pressure.   Eyes: Negative for discharge.  Respiratory: Negative for cough.   Cardiovascular: Negative for chest pain.  Gastrointestinal: Positive for abdominal pain. Negative for diarrhea.  Genitourinary: Negative for frequency and hematuria.  Musculoskeletal: Negative for back pain.  Skin: Negative for rash.  Neurological: Negative for seizures and headaches.  Psychiatric/Behavioral: Negative for hallucinations.      Allergies  Doxycycline and  Latex  Home Medications   Prior to Admission medications   Medication Sig Start Date End Date Taking? Authorizing Provider  Alpha-D-Galactosidase Charlyne Quale(BEANO) TABS Take 1-2 tablets by mouth 2 (two) times daily as needed (gas).   Yes Historical Provider, MD  aspirin-acetaminophen-caffeine (EXCEDRIN MIGRAINE) [phone] MG tablet Take 2 tablets by mouth every 6 (six) hours as needed for headache.   Yes Historical Provider, MD  calcium carbonate (TUMS - DOSED IN MG ELEMENTAL CALCIUM) 500 MG chewable tablet Chew 1 tablet by mouth daily as needed for indigestion or heartburn.    Yes Historical Provider, MD  fluticasone (FLONASE) 50 MCG/ACT nasal spray Place 2 sprays into both nostrils daily. Patient taking differently: Place 2 sprays into both nostrils daily as needed for allergies.  04/10/15  Yes Samuel JesterKathleen McManus, DO  naproxen sodium (ANAPROX) 220 MG tablet Take 440 mg by mouth 2 (two) times daily as needed (pain).   Yes Historical Provider, MD  OVER THE COUNTER MEDICATION Take 2 tablets by mouth every 4 (four) hours as needed (sinus presssure and drainage). [employer] sinus and allergy med   Yes Historical Provider, MD  cephALEXin (KEFLEX) 500 MG capsule Take 1 capsule (500 mg total) by mouth 4 (four) times daily. Patient not taking: Reported on 07/30/2015 06/03/15   Geoffery Lyonsouglas Delo, MD  ciprofloxacin-hydrocortisone (CIPRO [facility]) otic suspension Place 3 drops into both ears 2 (two) times daily. Patient not taking: Reported on 07/30/2015 05/08/15   Catha GosselinHanna Patel-Mills, PA-C  estradiol cypionate (DEPO-ESTRADIOL) 5 MG/ML injection  Inject 1.5 mg into the muscle every 28 (twenty-eight) days.    Historical Provider, MD  famotidine (PEPCID) 20 MG tablet Take 1 tablet (20 mg total) by mouth 2 (two) times daily. 08/05/15   Arby Barrette, MD  metroNIDAZOLE (FLAGYL) 500 MG tablet Take all four tabs at once Patient not taking: Reported on 04/10/2015 12/08/14   Deirdre C Poe, CNM  ondansetron (ZOFRAN ODT) 4 MG disintegrating tablet  Take 1 tablet (4 mg total) by mouth every 4 (four) hours as needed for nausea or vomiting. 08/05/15   Arby Barrette, MD  pseudoephedrine (SUDAFED) 30 MG tablet Take 1 tablet (30 mg total) by mouth every 4 (four) hours as needed for congestion. Patient not taking: Reported on 07/30/2015 05/08/15   Catha Gosselin, PA-C  ranitidine (ZANTAC) 150 MG capsule Take 1 capsule (150 mg total) by mouth 2 (two) times daily. 09/28/15   Bethann Berkshire, MD   BP 103/67 mmHg  Pulse 77  Temp(Src) 98.4 F (36.9 C) (Oral)  Resp 18  SpO2 100%  LMP  Physical Exam  Constitutional: She is oriented to person, place, and time. She appears well-developed.  HENT:  Head: Normocephalic.  Eyes: Conjunctivae and EOM are normal. No scleral icterus.  Neck: Neck supple. No thyromegaly present.  Cardiovascular: Normal rate and regular rhythm.  Exam reveals no gallop and no friction rub.   No murmur heard. Pulmonary/Chest: No stridor. She has no wheezes. She has no rales. She exhibits no tenderness.  Abdominal: She exhibits no distension. There is tenderness. There is no rebound.  Mild tender epigastric  Musculoskeletal: Normal range of motion. She exhibits no edema.  Lymphadenopathy:    She has no cervical adenopathy.  Neurological: She is oriented to person, place, and time. She exhibits normal muscle tone. Coordination normal.  Skin: No rash noted. No erythema.  Psychiatric: She has a normal mood and affect. Her behavior is normal.    ED Course  Procedures (including critical care time) Labs Review Labs Reviewed  LIPASE, BLOOD  COMPREHENSIVE METABOLIC PANEL  CBC  URINALYSIS, ROUTINE W REFLEX MICROSCOPIC (NOT AT [facility])  POC URINE PREG, ED    Imaging Review Dg Abd Acute W/chest  09/28/2015  CLINICAL DATA:  Right-sided abdominal pain for 1 week. EXAM: DG ABDOMEN ACUTE W/ 1V CHEST COMPARISON:  Chest x-ray on 09/09/2013 FINDINGS: There is no evidence of dilated bowel loops or free intraperitoneal air. No  radiopaque calculi or other significant radiographic abnormality is seen. Heart size and mediastinal contours are within normal limits. There is no evidence of pulmonary edema, consolidation, pneumothorax, nodule or pleural fluid. IMPRESSION: Negative abdominal radiographs.  No acute cardiopulmonary disease. Electronically Signed   By: Irish Lack M.D.   On: 09/28/2015 15:56   I have personally reviewed and evaluated these images and lab results as part of my medical decision-making.   EKG Interpretation None      MDM   Final diagnoses:  Gastritis    Labs x-rays unremarkable. Patient will be treated for GERD with Zantac and follow-up with GI.    Bethann Berkshire, MD 09/28/15 [phone]

## 2015-09-28 NOTE — ED Notes (Signed)
Pt to x-ray and returned to room without distress noted. 

## 2015-09-28 NOTE — ED Notes (Signed)
Awake. Verbally responsive. A/O x4. Resp even and unlabored. No audible adventitious breath sounds noted. ABC's intact. Family at bedside. 

## 2016-01-22 IMAGING — CR DG ABDOMEN ACUTE W/ 1V CHEST
3 series · 3 of 3 positions shown · non-contrast
Comparison: Chest x-ray on 09/09/2013

CLINICAL DATA: Right-sided abdominal pain for 1 week.

EXAM:
DG ABDOMEN ACUTE W/ 1V CHEST

[w chest pa]
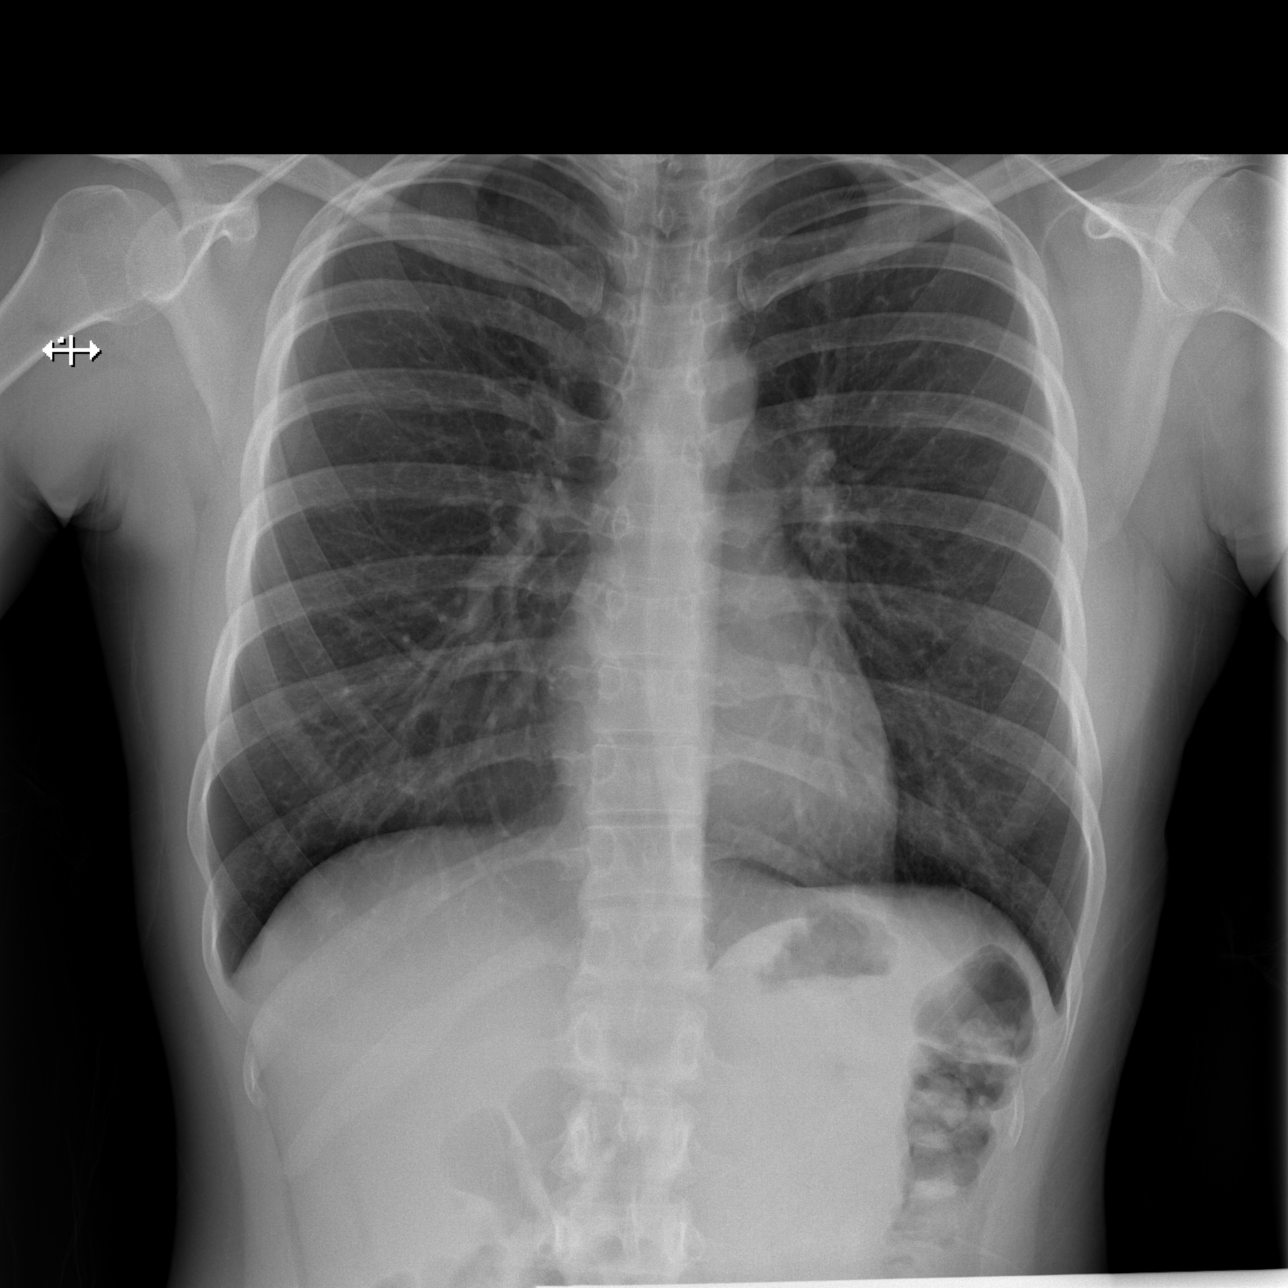

[w abdomen upright]
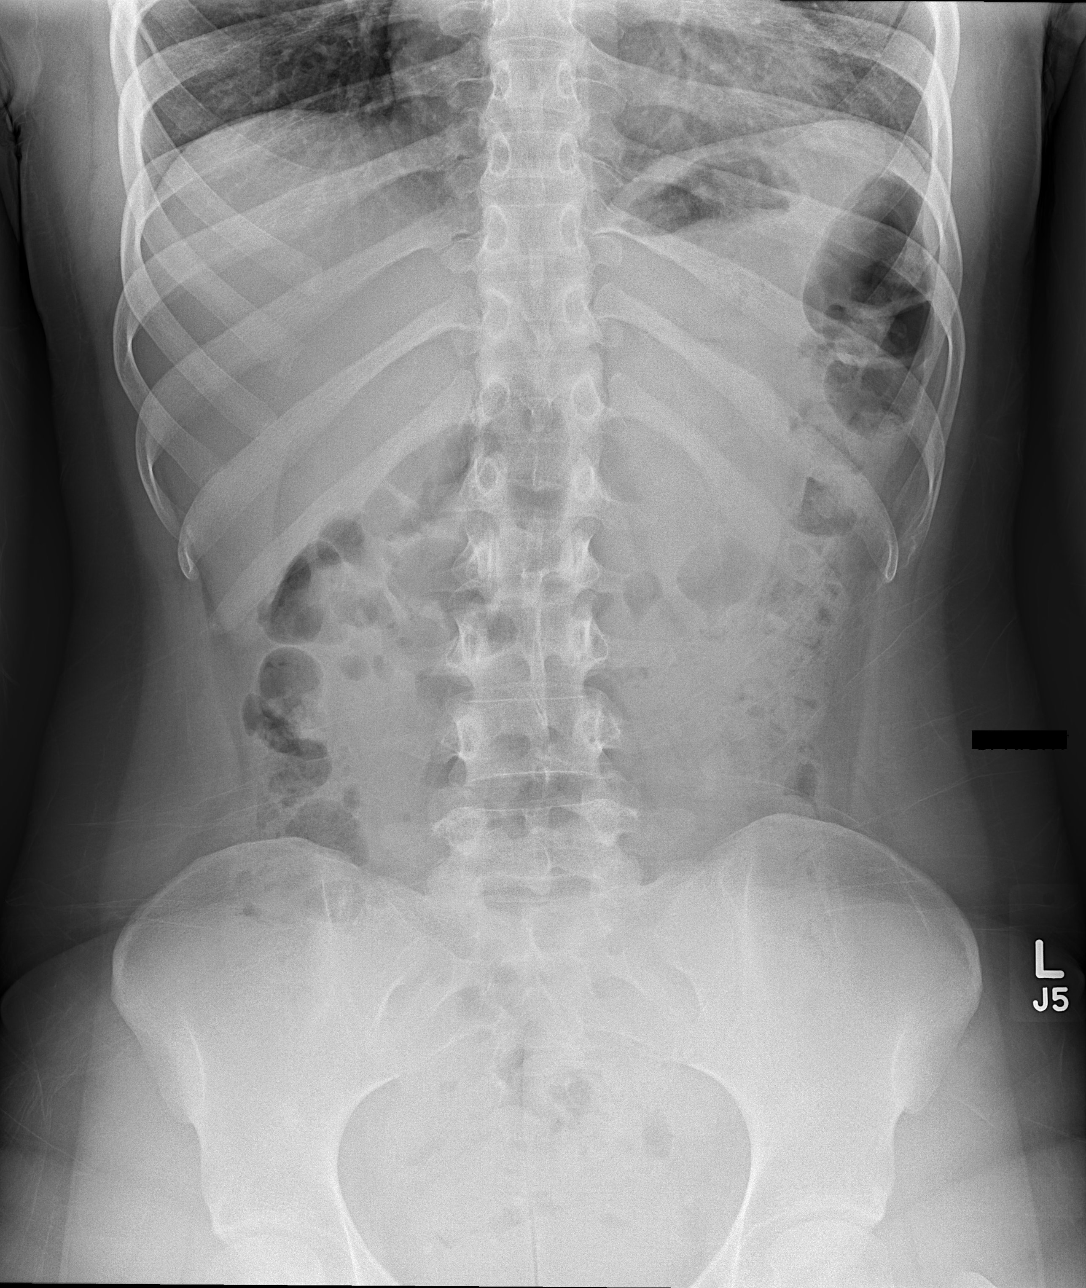

[t abdomen supine]
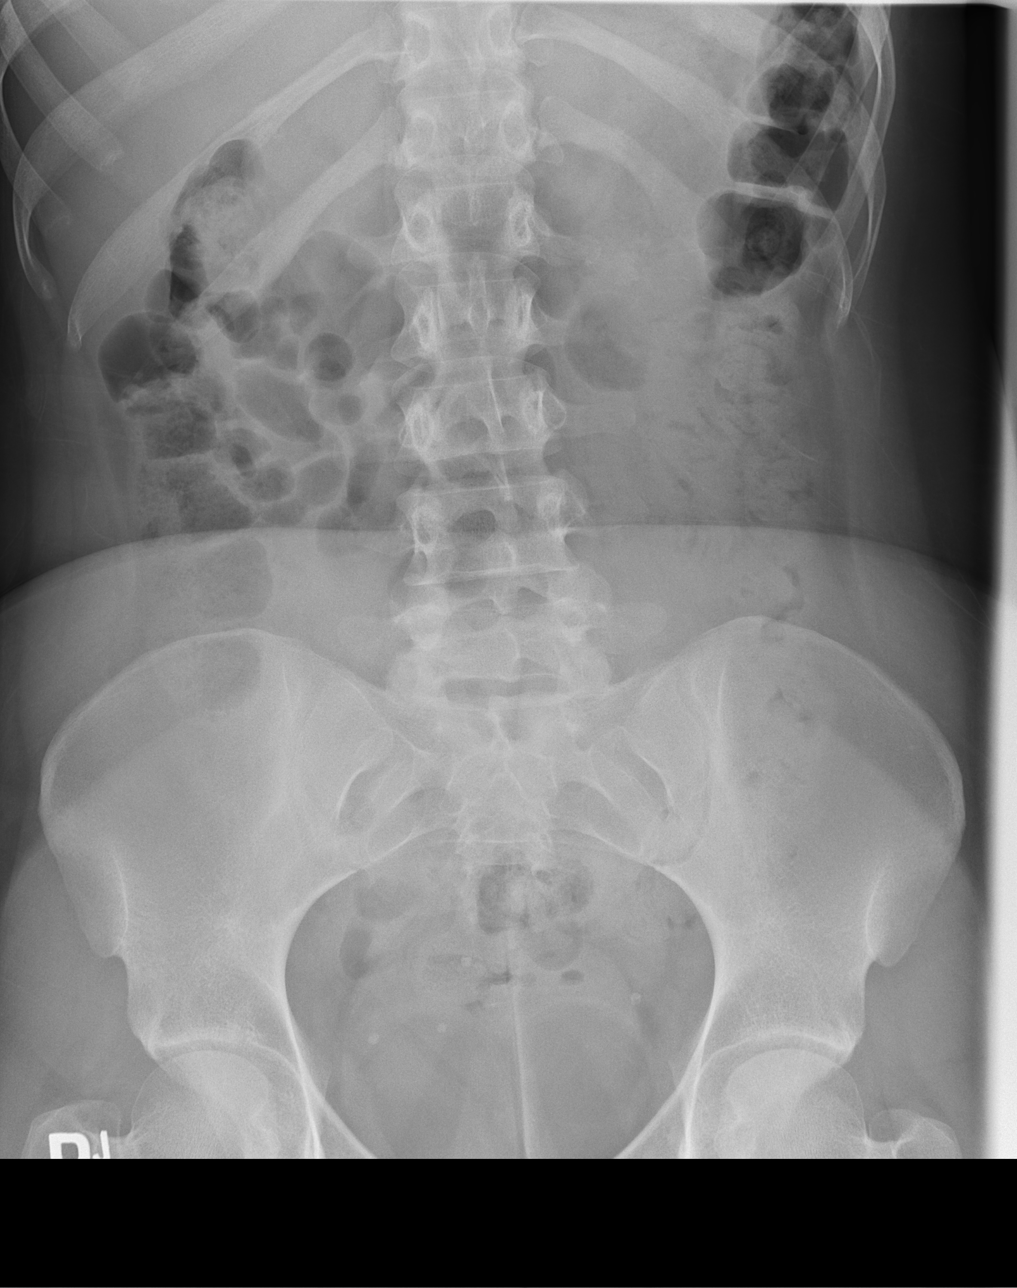

[3 of 3 positions shown; findings below may reference images not displayed]

FINDINGS: There is no evidence of dilated bowel loops or free intraperitoneal
air. No radiopaque calculi or other significant radiographic
abnormality is seen. Heart size and mediastinal contours are within
normal limits. There is no evidence of pulmonary edema,
consolidation, pneumothorax, nodule or pleural fluid.
IMPRESSION: Negative abdominal radiographs.  No acute cardiopulmonary disease.

## 2016-04-06 ENCOUNTER — Encounter (HOSPITAL_COMMUNITY): Payer: Self-pay | Admitting: Emergency Medicine

## 2016-04-06 ENCOUNTER — Emergency Department (HOSPITAL_COMMUNITY): Payer: Self-pay

## 2016-04-06 ENCOUNTER — Emergency Department (HOSPITAL_COMMUNITY)
Admission: EM | Admit: 2016-04-06 | Discharge: 2016-04-06 | Disposition: A | Discharge status: Home / Self Care | Payer: Self-pay | Attending: Emergency Medicine | Admitting: Emergency Medicine

## 2016-04-06 DIAGNOSIS — R110 Nausea: Secondary | ICD-10-CM

## 2016-04-06 DIAGNOSIS — F17210 Nicotine dependence, cigarettes, uncomplicated: Secondary | ICD-10-CM | POA: Insufficient documentation

## 2016-04-06 DIAGNOSIS — Z7951 Long term (current) use of inhaled steroids: Secondary | ICD-10-CM | POA: Insufficient documentation

## 2016-04-06 DIAGNOSIS — K219 Gastro-esophageal reflux disease without esophagitis: Secondary | ICD-10-CM | POA: Insufficient documentation

## 2016-04-06 DIAGNOSIS — Z79899 Other long term (current) drug therapy: Secondary | ICD-10-CM | POA: Insufficient documentation

## 2016-04-06 DIAGNOSIS — K2970 Gastritis, unspecified, without bleeding: Secondary | ICD-10-CM | POA: Insufficient documentation

## 2016-04-06 DIAGNOSIS — Z91040 Latex allergy status: Secondary | ICD-10-CM | POA: Insufficient documentation

## 2016-04-06 DIAGNOSIS — Z3202 Encounter for pregnancy test, result negative: Secondary | ICD-10-CM | POA: Insufficient documentation

## 2016-04-06 DIAGNOSIS — R1011 Right upper quadrant pain: Secondary | ICD-10-CM

## 2016-04-06 LAB — URINALYSIS, ROUTINE W REFLEX MICROSCOPIC
BILIRUBIN URINE: NEGATIVE
GLUCOSE, UA: NEGATIVE mg/dL
Hgb urine dipstick: NEGATIVE
KETONES UR: 15 mg/dL — AB
Leukocytes, UA: NEGATIVE
Nitrite: NEGATIVE
PH: 6.5 (ref 5.0–8.0)
Protein, ur: NEGATIVE mg/dL
Specific Gravity, Urine: 1.008 (ref 1.005–1.030)

## 2016-04-06 LAB — CBC
HEMATOCRIT: 41.8 % (ref 36.0–46.0)
Hemoglobin: 14 g/dL (ref 12.0–15.0)
MCH: 27.7 pg (ref 26.0–34.0)
MCHC: 33.5 g/dL (ref 30.0–36.0)
MCV: 82.8 fL (ref 78.0–100.0)
Platelets: 268 10*3/uL (ref 150–400)
RBC: 5.05 MIL/uL (ref 3.87–5.11)
RDW: 12.7 % (ref 11.5–15.5)
WBC: 8.7 10*3/uL (ref 4.0–10.5)

## 2016-04-06 LAB — COMPREHENSIVE METABOLIC PANEL
ALT: 11 U/L — AB (ref 14–54)
AST: 17 U/L (ref 15–41)
Albumin: 4.8 g/dL (ref 3.5–5.0)
Alkaline Phosphatase: 65 U/L (ref 38–126)
Anion gap: 11 (ref 5–15)
BILIRUBIN TOTAL: 0.4 mg/dL (ref 0.3–1.2)
BUN: 6 mg/dL (ref 6–20)
CHLORIDE: 106 mmol/L (ref 101–111)
CO2: 24 mmol/L (ref 22–32)
Calcium: 9.6 mg/dL (ref 8.9–10.3)
Creatinine, Ser: 0.74 mg/dL (ref 0.44–1.00)
GFR calc Af Amer: >60 mL/min (ref >60)
GLUCOSE: 95 mg/dL (ref 65–99)
POTASSIUM: 3.8 mmol/L (ref 3.5–5.1)
Sodium: 141 mmol/L (ref 135–145)
Total Protein: 7.8 g/dL (ref 6.5–8.1)

## 2016-04-06 LAB — LIPASE, BLOOD: LIPASE: 21 U/L (ref 11–51)

## 2016-04-06 LAB — I-STAT BETA HCG BLOOD, ED (MC, WL, AP ONLY): I-stat hCG, quantitative: <5 m[IU]/mL (ref <5)

## 2016-04-06 MED ORDER — PANTOPRAZOLE SODIUM 40 MG IV SOLR
40.0000 mg | Freq: Once | INTRAVENOUS | Status: AC
  Administered 2016-04-06: 40 mg via INTRAVENOUS
  Filled 2016-04-06: qty 40

## 2016-04-06 MED ORDER — SODIUM CHLORIDE 0.9 % IV BOLUS (SEPSIS)
1000.0000 mL | Freq: Once | INTRAVENOUS | Status: AC
  Administered 2016-04-06: 1000 mL via INTRAVENOUS

## 2016-04-06 MED ORDER — ONDANSETRON HCL 8 MG PO TABS: 8.0000 mg | ORAL_TABLET | Freq: Three times a day (TID) | ORAL | Status: AC | PRN

## 2016-04-06 MED ORDER — ONDANSETRON HCL 4 MG/2ML IJ SOLN
4.0000 mg | Freq: Once | INTRAMUSCULAR | Status: AC
  Administered 2016-04-06: 4 mg via INTRAVENOUS
  Filled 2016-04-06: qty 2

## 2016-04-06 MED ORDER — RANITIDINE HCL 150 MG PO TABS: 150.0000 mg | ORAL_TABLET | Freq: Two times a day (BID) | ORAL | Status: AC

## 2016-04-06 NOTE — ED Notes (Signed)
Patient transported to Ultrasound 

## 2016-04-06 NOTE — Discharge Instructions (Signed)
Your abdominal pain is likely from gastritis or an ulcer, but you could still be having issues with your gallbladder despite the fact that your ultrasound today did not reveal any gallstones and occasionally you'll need further testing like a HIDA scan to see if your gallbladder is the problem. You should start taking zantac as directed, and avoid spicy/fatty/acidic foods, avoid soda/coffee/tea. Avoid laying down flat within 30 minutes of eating. Avoid NSAIDs like ibuprofen/aleve/motrin/etc on an empty stomach. Continue taking protonix and you may consider using over the counter tums/maalox as needed for additional relief. Use zofran as directed as needed for nausea. Use tylenol as needed for pain. Follow up with the gastroenterologist in 1-2 weeks for ongoing evaluation of your abdominal pain. Return to the ER for changes or worsening symptoms.  Abdominal (belly) pain can be caused by many things. Your caregiver performed an examination and possibly ordered blood/urine tests and imaging (CT scan, x-rays, ultrasound). Many cases can be observed and treated at home after initial evaluation in the emergency department. Even though you are being discharged home, abdominal pain can be unpredictable. Therefore, you need a repeated exam if your pain does not resolve, returns, or worsens. Most patients with abdominal pain don't have to be admitted to the hospital or have surgery, but serious problems like appendicitis and gallbladder attacks can start out as nonspecific pain. Many abdominal conditions cannot be diagnosed in one visit, so follow-up evaluations are very important. SEEK IMMEDIATE MEDICAL ATTENTION IF YOU DEVELOP ANY OF THE FOLLOWING SYMPTOMS:  The pain does not go away or becomes severe.   A temperature above 101 develops.   Repeated vomiting occurs (multiple episodes).   The pain becomes localized to portions of the abdomen. The right side could possibly be appendicitis. In an adult, the left  lower portion of the abdomen could be colitis or diverticulitis.   Blood is being passed in stools or vomit (bright red or black tarry stools).   Return also if you develop chest pain, difficulty breathing, dizziness or fainting, or become confused, poorly responsive, or inconsolable (young children).  The constipation stays for more than 4 days.   There is belly (abdominal) or rectal pain.   You do not seem to be getting better.      Abdominal Pain, Adult Many things can cause belly (abdominal) pain. Most times, the belly pain is not dangerous. Many cases of belly pain can be watched and treated at home. HOME CARE   Do not take medicines that help you go poop (laxatives) unless told to by your doctor.  Only take medicine as told by your doctor.  Eat or drink as told by your doctor. Your doctor will tell you if you should be on a special diet. GET HELP IF:  You do not know what is causing your belly pain.  You have belly pain while you are sick to your stomach (nauseous) or have runny poop (diarrhea).  You have pain while you pee or poop.  Your belly pain wakes you up at night.  You have belly pain that gets worse or better when you eat.  You have belly pain that gets worse when you eat fatty foods.  You have a fever. GET HELP RIGHT AWAY IF:   The pain does not go away within 2 hours.  You keep throwing up (vomiting).  The pain changes and is only in the right or left part of the belly.  You have bloody or tarry looking poop. MAKE  SURE YOU:   Understand these instructions.  Will watch your condition.  Will get help right away if you are not doing well or get worse.   This information is not intended to replace advice given to you by your health care provider. Make sure you discuss any questions you have with your health care provider.   Document Released: 05/02/2008 Document Revised: 12/05/2014 Document Reviewed: 07/24/2013 Elsevier Interactive Patient  Education 2016 [employer]  Gastritis, Adult Gastritis is soreness and puffiness (inflammation) of the lining of the stomach. If you do not get help, gastritis can cause bleeding and sores (ulcers) in the stomach. HOME CARE   Only take medicine as told by your doctor.  If you were given antibiotic medicines, take them as told. Finish the medicines even if you start to feel better.  Drink enough fluids to keep your pee (urine) clear or pale yellow.  Avoid foods and drinks that make your problems worse. Foods you may want to avoid include:  Caffeine or alcohol.  Chocolate.  Mint.  Garlic and onions.  Spicy foods.  Citrus fruits, including oranges, lemons, or limes.  Food containing tomatoes, including sauce, chili, salsa, and pizza.  Fried and fatty foods.  Eat small meals throughout the day instead of large meals. GET HELP RIGHT AWAY IF:   You have black or dark red poop (stools).  You throw up (vomit) blood. It may look like coffee grounds.  You cannot keep fluids down.  Your belly (abdominal) pain gets worse.  You have a fever.  You do not feel better after 1 week.  You have any other questions or concerns. MAKE SURE YOU:   Understand these instructions.  Will watch your condition.  Will get help right away if you are not doing well or get worse.   This information is not intended to replace advice given to you by your health care provider. Make sure you discuss any questions you have with your health care provider.   Document Released: 05/02/2008 Document Revised: 02/06/2012 Document Reviewed: 12/28/2011 Elsevier Interactive Patient Education 2016 [employer]  Biliary Colic Biliary colic is a pain in the upper abdomen. The pain:  Is usually felt on the right side of the abdomen, but it may also be felt in the center of the abdomen, just below the breastbone (sternum).  May spread back toward the right shoulder blade.  May be steady or  irregular.  May be accompanied by nausea and vomiting. Most of the time, the pain goes away in 1-5 hours. After the most intense pain passes, the abdomen may continue to ache mildly for about 24 hours. Biliary colic is caused by a blockage in the bile duct. The bile duct is a pathway that carries bile--a liquid that helps to digest fats--from the gallbladder to the small intestine. Biliary colic usually occurs after eating, when the digestive system demands bile. The pain develops when muscle cells contract forcefully to try to move the blockage so that bile can get by. HOME CARE INSTRUCTIONS  Take medicines only as directed by your health care provider.  Drink enough fluid to keep your urine clear or pale yellow.  Avoid fatty, greasy, and fried foods. These kinds of foods increase your body's demand for bile.  Avoid any foods that make your pain worse.  Avoid overeating.  Avoid having a large meal after fasting. SEEK MEDICAL CARE IF:  You develop a fever.  Your pain gets worse.  You vomit.  You develop  nausea that prevents you from eating and drinking. SEEK IMMEDIATE MEDICAL CARE IF:  You suddenly develop a fever and shaking chills.  You develop a yellowish discoloration (jaundice) of:  Skin.  Whites of the eyes.  Mucous membranes.  You have continuous or severe pain that is not relieved with medicines.  You have nausea and vomiting that is not relieved with medicines.  You develop dizziness or you faint.   This information is not intended to replace advice given to you by your health care provider. Make sure you discuss any questions you have with your health care provider.   Document Released: 04/17/2006 Document Revised: 03/31/2015 Document Reviewed: 08/26/2014 Elsevier Interactive Patient Education 2016 [employer]  Nausea, Adult Nausea is the feeling that you have an upset stomach or have to vomit. Nausea by itself is not likely a serious concern, but it  may be an early sign of more serious medical problems. As nausea gets worse, it can lead to vomiting. If vomiting develops, there is the risk of dehydration.  CAUSES   Viral infections.  Food poisoning.  Medicines.  Pregnancy.  Motion sickness.  Migraine headaches.  Emotional distress.  Severe pain from any source.  Alcohol intoxication. HOME CARE INSTRUCTIONS  Get plenty of rest.  Ask your caregiver about specific rehydration instructions.  Eat small amounts of food and sip liquids more often.  Take all medicines as told by your caregiver. SEEK MEDICAL CARE IF:  You have not improved after 2 days, or you get worse.  You have a headache. SEEK IMMEDIATE MEDICAL CARE IF:   You have a fever.  You faint.  You keep vomiting or have blood in your vomit.  You are extremely weak or dehydrated.  You have dark or bloody stools.  You have severe chest or abdominal pain. MAKE SURE YOU:  Understand these instructions.  Will watch your condition.  Will get help right away if you are not doing well or get worse.   This information is not intended to replace advice given to you by your health care provider. Make sure you discuss any questions you have with your health care provider.   Document Released: 12/22/2004 Document Revised: 12/05/2014 Document Reviewed: 07/27/2011 Elsevier Interactive Patient Education [employer]

## 2016-04-06 NOTE — ED Notes (Signed)
Pt reports intermittent RUQ pain for the past week associated with nausea. No v/d though she feels like she could throw up sometimes. No known triggers.

## 2016-04-06 NOTE — ED Provider Notes (Signed)
CSN: [id number]     Arrival date & time 04/06/16  1015 History   First MD Initiated Contact with Patient 04/06/16 1107     Chief Complaint  Patient presents with  . Abdominal Pain  . Nausea     (Consider location/radiation/quality/duration/timing/severity/associated sxs/prior Treatment) HPI Comments: Courtney Martinez is a 25 y.o. female with a PMHx of GERD, IBS, and environmental allergies, who presents to the ED with complaints of intermittent right upper quadrant pain 1 week. She describes the pain as 5/10 sharp intermittent pain in the right upper quadrant, nonradiating, worse with eating especially when eating greasy or fried foods, and unrelieved with Tums and Protonix. Associated symptoms include nausea. She states she initially thought that it was from her gastritis, but when Tums and Protonix did not help she came to the emergency room for further evaluation. She states she attempted to drink alcohol on Saturday but this aggravated her symptoms so she did not continue drinking anymore. She drinks socially. She does admit to chronic NSAID use. LMP was 1 week ago. Her last meal was an apple around 8:30 AM. She states that her family members have had their gallbladders taken out but she has no known issues with her own gallbladder.  She denies any fevers, chills, chest pain, shortness breath, vomiting, diarrhea constipation, obstipation, melena, hematochezia, dysuria, hematuria, vaginal bleeding or discharge, numbness, tingling, weakness, recent travel, sick contacts, or any suspicious food intake.  Patient is a 25 y.o. female presenting with abdominal pain. The history is provided by the patient. No language interpreter was used.  Abdominal Pain Pain location:  RUQ Pain quality: sharp   Pain radiates to:  Does not radiate Pain severity:  Moderate Onset quality:  Gradual Duration:  1 week Timing:  Intermittent Progression:  Unchanged Chronicity:  New Context: eating   Context: not recent  travel, not sick contacts and not suspicious food intake   Relieved by:  Nothing Worsened by:  Eating Ineffective treatments:  Antacids and OTC medications Associated symptoms: nausea   Associated symptoms: no chest pain, no chills, no constipation, no diarrhea, no dysuria, no fever, no flatus, no hematemesis, no hematochezia, no hematuria, no melena, no shortness of breath, no vaginal bleeding, no vaginal discharge and no vomiting   Risk factors: NSAID use   Risk factors: no alcohol abuse and has not had multiple surgeries     Past Medical History  Diagnosis Date  . Acid reflux   . IBS (irritable bowel syndrome)   . Vaginal Pap smear, abnormal   . Environmental allergies    Past Surgical History  Procedure Laterality Date  . No past surgeries     Family History  Problem Relation Age of Onset  . Hypertension Other   . Diabetes Other   . Cancer Other    Social History  Substance Use Topics  . Smoking status: Current Every Day Smoker -- 0.25 packs/day for 4 years    Types: Cigars  . Smokeless tobacco: None  . Alcohol Use: Yes     Comment: social   OB History    No data available     Review of Systems  Constitutional: Negative for fever and chills.  Respiratory: Negative for shortness of breath.   Cardiovascular: Negative for chest pain.  Gastrointestinal: Positive for nausea and abdominal pain. Negative for vomiting, diarrhea, constipation, blood in stool, melena, hematochezia, flatus and hematemesis.  Genitourinary: Negative for dysuria, hematuria, vaginal bleeding and vaginal discharge.  Musculoskeletal: Negative for myalgias and arthralgias.  Skin: Negative for color change.  Allergic/Immunologic: Negative for immunocompromised state.  Neurological: Negative for weakness and numbness.  Psychiatric/Behavioral: Negative for confusion.   10 Systems reviewed and are negative for acute change except as noted in the HPI.    Allergies  Doxycycline and Latex  Home  Medications   Prior to Admission medications   Medication Sig Start Date End Date Taking? Authorizing Provider  Alpha-D-Galactosidase Charlyne Quale) TABS Take 1-2 tablets by mouth 2 (two) times daily as needed (gas).    Historical Provider, MD  aspirin-acetaminophen-caffeine (EXCEDRIN MIGRAINE) [phone] MG tablet Take 2 tablets by mouth every 6 (six) hours as needed for headache.    Historical Provider, MD  calcium carbonate (TUMS - DOSED IN MG ELEMENTAL CALCIUM) 500 MG chewable tablet Chew 1 tablet by mouth daily as needed for indigestion or heartburn.     Historical Provider, MD  cephALEXin (KEFLEX) 500 MG capsule Take 1 capsule (500 mg total) by mouth 4 (four) times daily. Patient not taking: Reported on 07/30/2015 06/03/15   Geoffery Lyons, MD  ciprofloxacin-hydrocortisone (CIPRO [facility]) otic suspension Place 3 drops into both ears 2 (two) times daily. Patient not taking: Reported on 07/30/2015 05/08/15   Catha Gosselin, PA-C  estradiol cypionate (DEPO-ESTRADIOL) 5 MG/ML injection Inject 1.5 mg into the muscle every 28 (twenty-eight) days.    Historical Provider, MD  famotidine (PEPCID) 20 MG tablet Take 1 tablet (20 mg total) by mouth 2 (two) times daily. 08/05/15   Arby Barrette, MD  fluticasone (FLONASE) 50 MCG/ACT nasal spray Place 2 sprays into both nostrils daily. Patient taking differently: Place 2 sprays into both nostrils daily as needed for allergies.  04/10/15   Samuel Jester, DO  metroNIDAZOLE (FLAGYL) 500 MG tablet Take all four tabs at once Patient not taking: Reported on 04/10/2015 12/08/14   Deirdre C Poe, CNM  naproxen sodium (ANAPROX) 220 MG tablet Take 440 mg by mouth 2 (two) times daily as needed (pain).    Historical Provider, MD  ondansetron (ZOFRAN ODT) 4 MG disintegrating tablet Take 1 tablet (4 mg total) by mouth every 4 (four) hours as needed for nausea or vomiting. 08/05/15   Arby Barrette, MD  OVER THE COUNTER MEDICATION Take 2 tablets by mouth every 4 (four) hours as needed (sinus  presssure and drainage). [employer] sinus and allergy med    Historical Provider, MD  pseudoephedrine (SUDAFED) 30 MG tablet Take 1 tablet (30 mg total) by mouth every 4 (four) hours as needed for congestion. Patient not taking: Reported on 07/30/2015 05/08/15   Catha Gosselin, PA-C  ranitidine (ZANTAC) 150 MG capsule Take 1 capsule (150 mg total) by mouth 2 (two) times daily. 09/28/15   Bethann Berkshire, MD   BP 105/62 mmHg  Pulse 77  Temp(Src) 98.8 F (37.1 C) (Oral)  Resp 16  SpO2 100% Physical Exam  Constitutional: She is oriented to person, place, and time. Vital signs are normal. She appears well-developed and well-nourished.  Non-toxic appearance. No distress.  Afebrile, nontoxic, NAD  HENT:  Head: Normocephalic and atraumatic.  Mouth/Throat: Oropharynx is clear and moist and mucous membranes are normal.  Eyes: Conjunctivae and EOM are normal. Right eye exhibits no discharge. Left eye exhibits no discharge.  Neck: Normal range of motion. Neck supple.  Cardiovascular: Normal rate, regular rhythm, normal heart sounds and intact distal pulses.  Exam reveals no gallop and no friction rub.   No murmur heard. Pulmonary/Chest: Effort normal and breath sounds normal. No respiratory distress. She has no decreased breath  sounds. She has no wheezes. She has no rhonchi. She has no rales.  Abdominal: Soft. Normal appearance and bowel sounds are normal. She exhibits no distension. There is tenderness in the epigastric area. There is positive Murphy's sign. There is no rigidity, no rebound, no guarding, no CVA tenderness and no tenderness at McBurney's point.    Soft, nondistended, +BS throughout, with epigastric and RUQ TTP, no r/g/r, +murphy's, neg mcburney's, no CVA TTP   Musculoskeletal: Normal range of motion.  Neurological: She is alert and oriented to person, place, and time. She has normal strength. No sensory deficit.  Skin: Skin is warm, dry and intact. No rash noted.  Psychiatric: She  has a normal mood and affect.  Nursing note and vitals reviewed.   ED Course  Procedures (including critical care time) Labs Review Labs Reviewed  COMPREHENSIVE METABOLIC PANEL - Abnormal; Notable for the following:    ALT 11 (*)    All other components within normal limits  URINALYSIS, ROUTINE W REFLEX MICROSCOPIC (NOT [facility]) - Abnormal; Notable for the following:    Ketones, ur 15 (*)    All other components within normal limits  LIPASE, BLOOD  CBC  I-STAT BETA HCG BLOOD, ED ([facility], [facility], [facility] ONLY)    Imaging Review [place] Abdomen Complete  04/06/2016  CLINICAL DATA:  RIGHT upper quadrant pain.  Evaluate gallbladder. EXAM: ABDOMEN ULTRASOUND COMPLETE COMPARISON:  None. FINDINGS: Gallbladder: No gallstones or wall thickening visualized. No sonographic Murphy sign noted by sonographer. Common bile duct: Diameter: Normal, measuring 2 mm Liver: No focal lesion identified. Within normal limits in parenchymal echogenicity. IVC: No abnormality visualized. Pancreas: Visualized portion unremarkable. Spleen: Size and appearance within normal limits. Right Kidney: Length: 11.3 cm. Echogenicity within normal limits. No mass or hydronephrosis visualized. Left Kidney: Length: 10.7 cm. Echogenicity within normal limits. No mass or hydronephrosis visualized. Abdominal aorta: No aneurysm visualized. Other findings: None. IMPRESSION: Unremarkable abdominal ultrasound. Electronically Signed   By: Elsie StainJohn T Curnes M.D.   On: 04/06/2016 12:25   I have personally reviewed and evaluated these images and lab results as part of my medical decision-making.   EKG Interpretation None      MDM   Final diagnoses:  RUQ abdominal pain  Gastritis  Nausea    25 y.o. female history of intermittent right upper quadrant abdominal pain and nausea 1 week. On exam, right upper quadrant tenderness with positive Murphy sign. Some epigastric tenderness but not as focal as the right upper quadrant tenderness. Seems likely to be  related to gallbladder etiology, family members have had gallbladder issues in the past as well although she doesn't know of any issues with her own. Will get labs, U/A, and abd u/s. Pt declines wanting anything for pain. Will give fluids, zofran, and protonix. No lower abd tenderness, no vaginal complaints, doubt need for pelvic exam. Will reassess shortly  2:21 PM U/A unremarkable, labs all WNL. U/S unremarkable. Could be gastritis vs still being gallbladder related and potentially needing a HIDA scan. Nausea improved. Discussed diet modifications for gastritis, avoidance of NSAIDs, use of tylenol for pain, and starting on zantac in addition to tums and protonix. Will send home with zofran rx. F/up with GI in 1-2wks for ongoing management of her recurrent abd pain and possibly for outpt HIDA scan or potentially EGD. I explained the diagnosis and have given explicit precautions to return to the ER including for any other new or worsening symptoms. The patient understands and accepts the medical plan as  it's been dictated and I have answered their questions. Discharge instructions concerning home care and prescriptions have been given. The patient is STABLE and is discharged to home in good condition.  BP 103/63 mmHg  Pulse 74  Temp(Src) 98.8 F (37.1 C) (Oral)  Resp 16  SpO2 100%  Meds ordered this encounter  Medications  . sodium chloride 0.9 % bolus 1,000 mL    Sig:   . ondansetron (ZOFRAN) injection 4 mg    Sig:   . pantoprazole (PROTONIX) injection 40 mg    Sig:   . ranitidine (ZANTAC) 150 MG tablet    Sig: Take 1 tablet (150 mg total) by mouth 2 (two) times daily.    Dispense:  30 tablet    Refill:  0    Order Specific Question:  Supervising Provider    Answer:  MILLER, BRIAN [[id number]]  . ondansetron (ZOFRAN) 8 MG tablet    Sig: Take 1 tablet (8 mg total) by mouth every 8 (eight) hours as needed for nausea or vomiting.    Dispense:  10 tablet    Refill:  0    Order Specific Question:   Supervising Provider    Answer:  Eber Hong [[id number]]     Courtney Manzer Camprubi-Soms, PA-C 04/06/16 1430  Alvira Monday, MD 04/08/16 1112

## 2017-05-31 IMAGING — US US ABDOMEN COMPLETE
1 series · 14 of 25 positions shown · non-contrast
Comparison: None.

CLINICAL DATA: RIGHT upper quadrant pain.  Evaluate gallbladder.

EXAM:
ABDOMEN ULTRASOUND COMPLETE

[Series 1: us abdomen complete · 0.17mm/px · 14 of 104 slices shown]
[im 1/104]
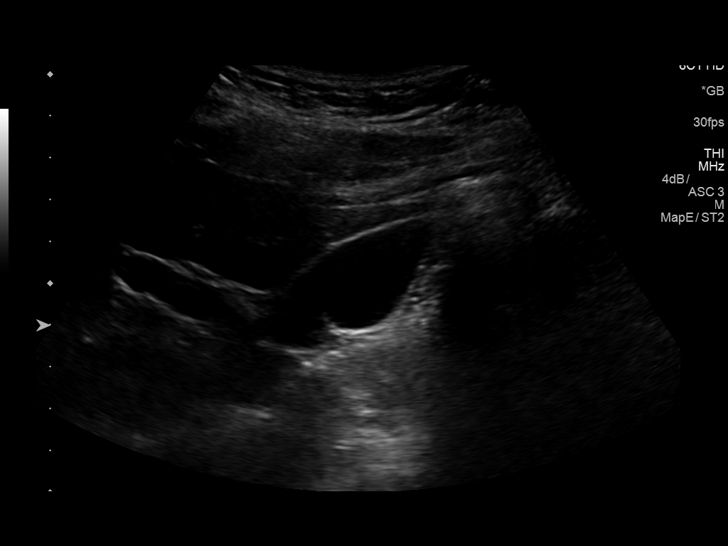
[im 9/104]
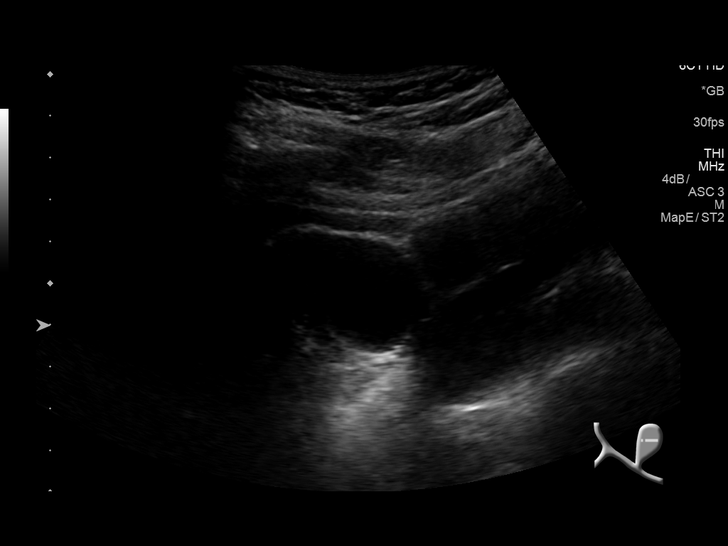
[im 18/104]
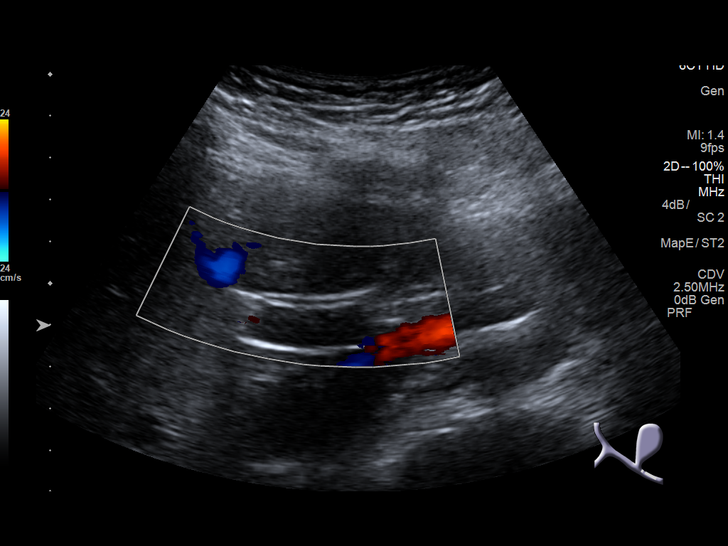
[im 26/104]
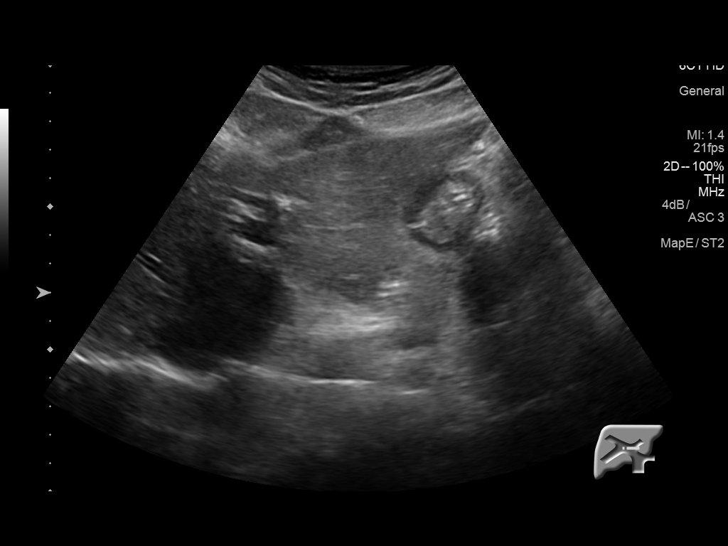
[im 35/104]
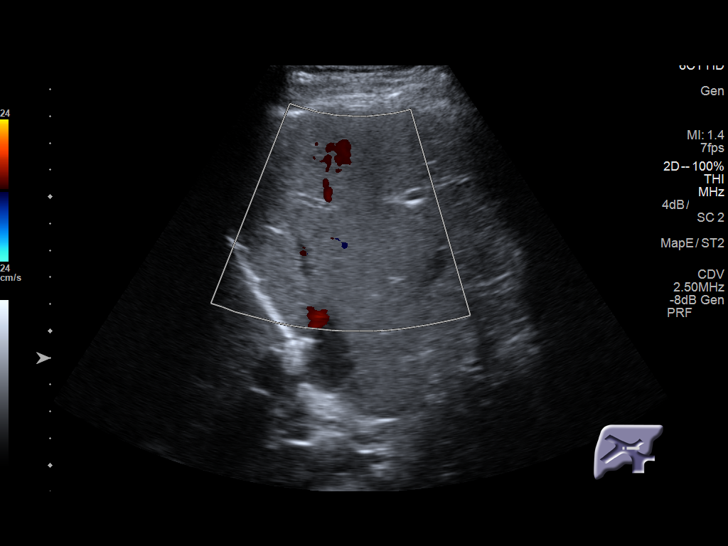
[im 39/104]
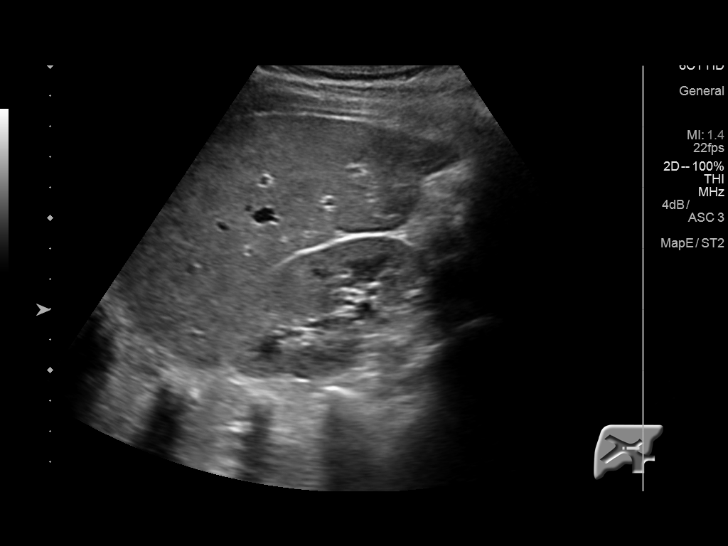
[im 48/104]
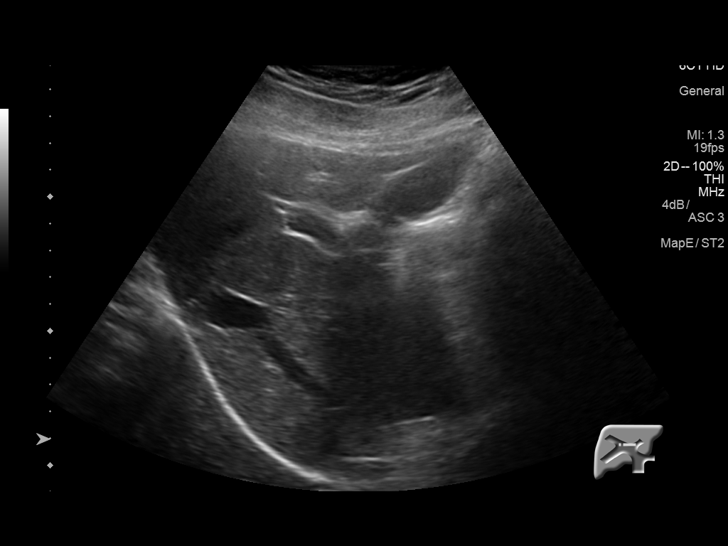
[im 56/104]
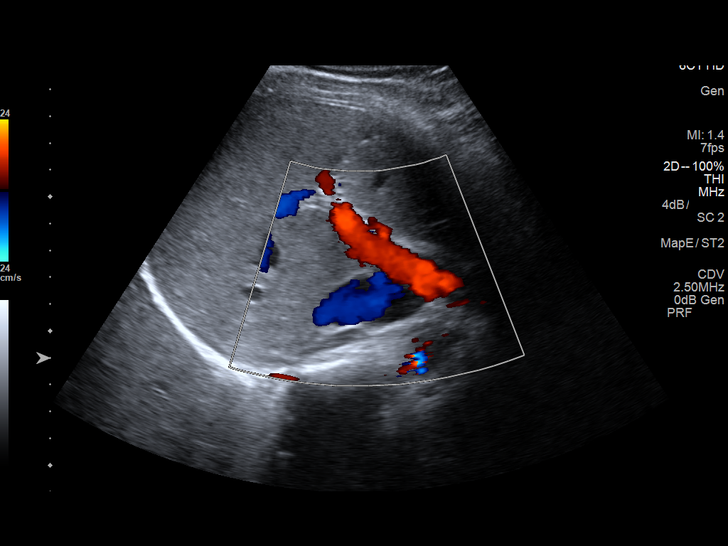
[im 65/104]
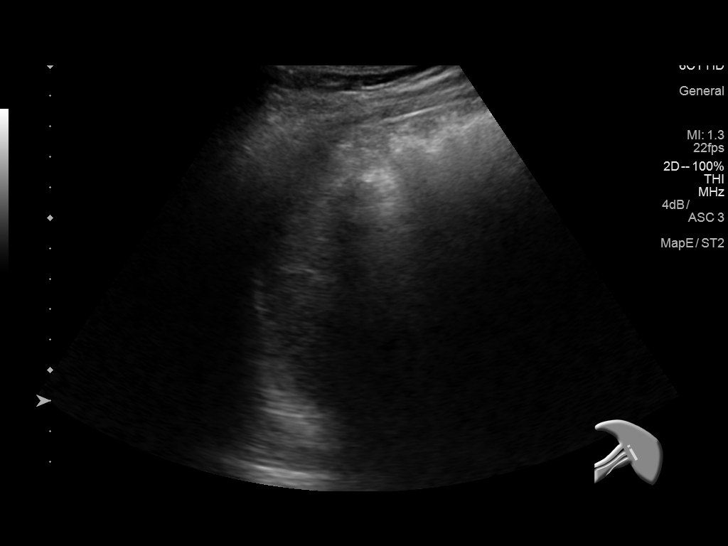
[im 69/104]
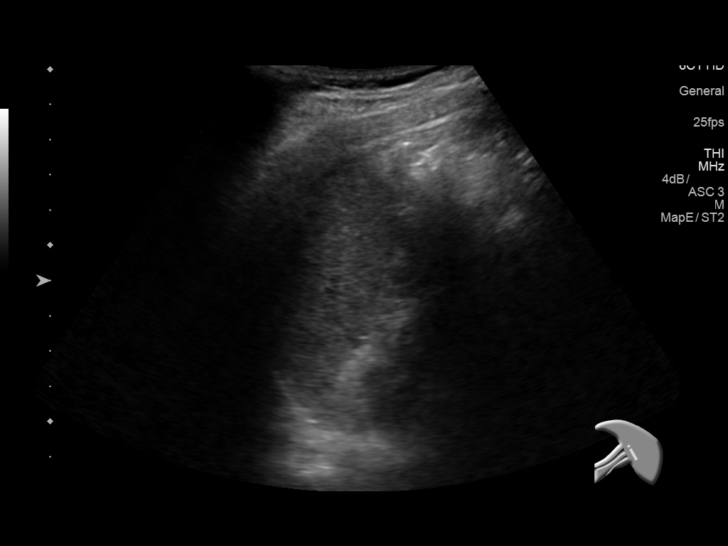
[im 78/104]
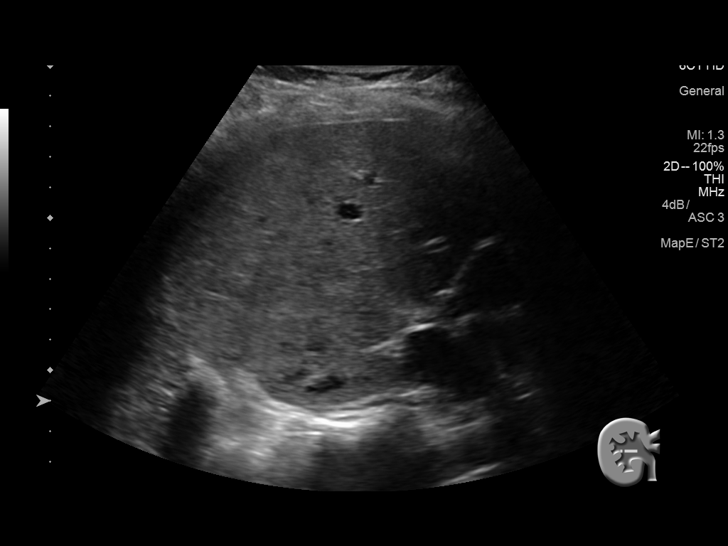
[im 86/104]
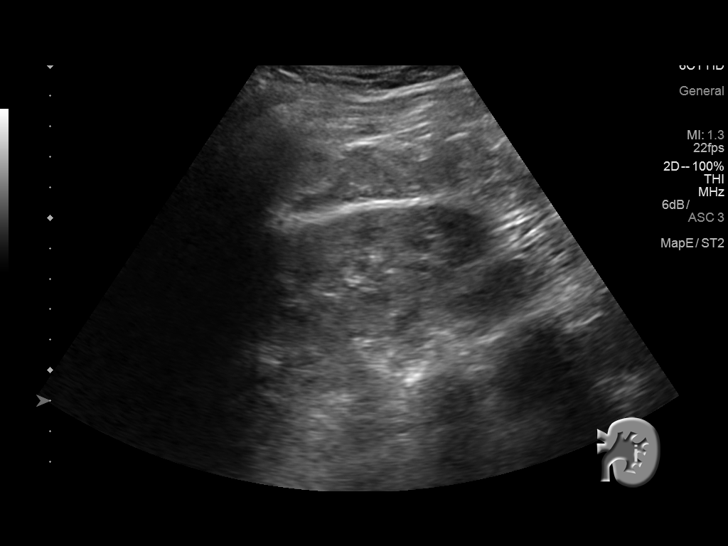
[im 95/104]
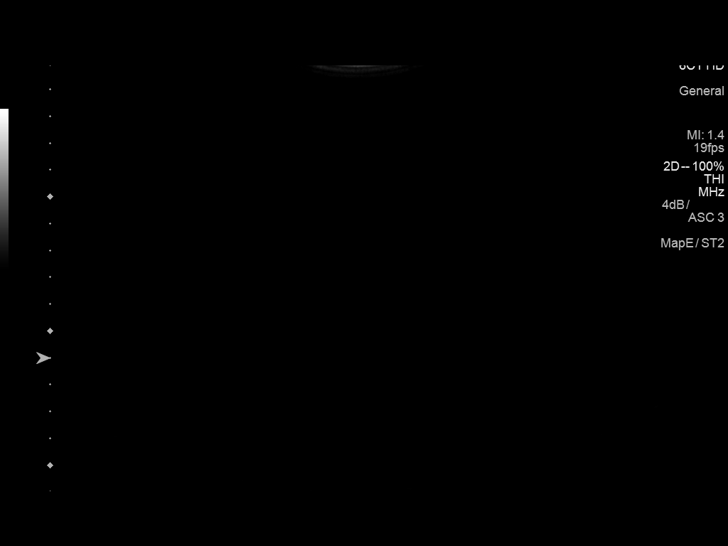
[im 104/104]
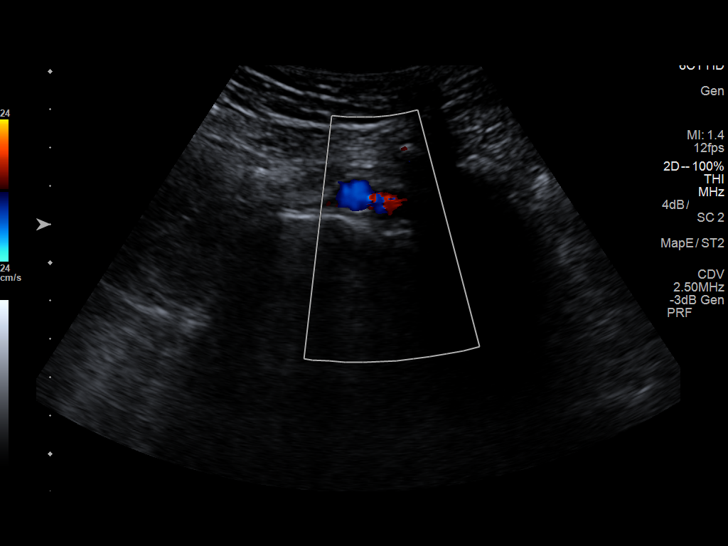

[14 of 25 positions shown; findings below may reference images not displayed]

FINDINGS: Gallbladder: No gallstones or wall thickening visualized. No
sonographic Murphy sign noted by sonographer.

Common bile duct: Diameter: Normal, measuring 2 mm

Liver: No focal lesion identified. Within normal limits in
parenchymal echogenicity.

IVC: No abnormality visualized.

Pancreas: Visualized portion unremarkable.

Spleen: Size and appearance within normal limits.

Right Kidney: Length: 11.3 cm. Echogenicity within normal limits. No
mass or hydronephrosis visualized.

Left Kidney: Length: 10.7 cm. Echogenicity within normal limits. No
mass or hydronephrosis visualized.

Abdominal aorta: No aneurysm visualized.

Other findings: None.
IMPRESSION: Unremarkable abdominal ultrasound.
